# Patient Record
Sex: Male | Born: 1955 | Race: White | Hispanic: No | Marital: Married | Smoking: Never smoker
Health system: Southern US, Community
[De-identification: ages and names within clinical notes are randomized; demographics above are authoritative.]

## PROBLEM LIST (undated history)

## (undated) DIAGNOSIS — R3914 Feeling of incomplete bladder emptying: Secondary | ICD-10-CM

## (undated) DIAGNOSIS — Z973 Presence of spectacles and contact lenses: Secondary | ICD-10-CM

## (undated) DIAGNOSIS — E785 Hyperlipidemia, unspecified: Secondary | ICD-10-CM

## (undated) DIAGNOSIS — N400 Enlarged prostate without lower urinary tract symptoms: Secondary | ICD-10-CM

## (undated) DIAGNOSIS — Z87442 Personal history of urinary calculi: Secondary | ICD-10-CM

## (undated) DIAGNOSIS — M1990 Unspecified osteoarthritis, unspecified site: Secondary | ICD-10-CM

## (undated) DIAGNOSIS — I219 Acute myocardial infarction, unspecified: Secondary | ICD-10-CM

## (undated) DIAGNOSIS — Z8614 Personal history of Methicillin resistant Staphylococcus aureus infection: Secondary | ICD-10-CM

## (undated) DIAGNOSIS — G609 Hereditary and idiopathic neuropathy, unspecified: Secondary | ICD-10-CM

## (undated) DIAGNOSIS — Z974 Presence of external hearing-aid: Secondary | ICD-10-CM

## (undated) DIAGNOSIS — R7303 Prediabetes: Secondary | ICD-10-CM

## (undated) DIAGNOSIS — R399 Unspecified symptoms and signs involving the genitourinary system: Secondary | ICD-10-CM

## (undated) DIAGNOSIS — I2510 Atherosclerotic heart disease of native coronary artery without angina pectoris: Secondary | ICD-10-CM

## (undated) DIAGNOSIS — I10 Essential (primary) hypertension: Secondary | ICD-10-CM

## (undated) HISTORY — PX: COLONOSCOPY: SHX5424

## (undated) HISTORY — PX: TONSILLECTOMY: SUR1361

---

## 1999-01-26 ENCOUNTER — Ambulatory Visit (HOSPITAL_COMMUNITY): Admission: RE | Admit: 1999-01-26 | Discharge: 1999-01-26 | Payer: Self-pay | Admitting: Urology

## 2000-08-06 ENCOUNTER — Encounter: Admission: RE | Admit: 2000-08-06 | Discharge: 2000-08-06 | Payer: Self-pay | Admitting: Family Medicine

## 2000-08-06 ENCOUNTER — Encounter: Payer: Self-pay | Admitting: Family Medicine

## 2004-09-06 ENCOUNTER — Inpatient Hospital Stay (HOSPITAL_COMMUNITY): Admission: EM | Admit: 2004-09-06 | Discharge: 2004-09-13 | Payer: Self-pay | Admitting: Emergency Medicine

## 2004-09-06 ENCOUNTER — Ambulatory Visit: Payer: Self-pay | Admitting: Infectious Diseases

## 2004-10-31 ENCOUNTER — Ambulatory Visit: Payer: Self-pay | Admitting: Infectious Diseases

## 2004-11-05 ENCOUNTER — Ambulatory Visit (HOSPITAL_COMMUNITY): Admission: RE | Admit: 2004-11-05 | Discharge: 2004-11-05 | Payer: Self-pay | Admitting: Infectious Diseases

## 2005-09-21 ENCOUNTER — Emergency Department (HOSPITAL_COMMUNITY): Admission: EM | Admit: 2005-09-21 | Discharge: 2005-09-21 | Payer: Self-pay | Admitting: Emergency Medicine

## 2005-12-02 ENCOUNTER — Ambulatory Visit (HOSPITAL_COMMUNITY): Admission: RE | Admit: 2005-12-02 | Discharge: 2005-12-02 | Payer: Self-pay | Admitting: Urology

## 2005-12-02 ENCOUNTER — Ambulatory Visit (HOSPITAL_BASED_OUTPATIENT_CLINIC_OR_DEPARTMENT_OTHER): Admission: RE | Admit: 2005-12-02 | Discharge: 2005-12-02 | Payer: Self-pay | Admitting: Urology

## 2005-12-02 HISTORY — PX: OTHER SURGICAL HISTORY: SHX169

## 2006-12-30 ENCOUNTER — Encounter (INDEPENDENT_AMBULATORY_CARE_PROVIDER_SITE_OTHER): Payer: Self-pay | Admitting: Specialist

## 2006-12-30 ENCOUNTER — Ambulatory Visit (HOSPITAL_COMMUNITY): Admission: RE | Admit: 2006-12-30 | Discharge: 2006-12-30 | Payer: Self-pay | Admitting: Gastroenterology

## 2008-01-29 ENCOUNTER — Emergency Department (HOSPITAL_COMMUNITY): Admission: AC | Admit: 2008-01-29 | Discharge: 2008-01-29 | Payer: Self-pay

## 2010-09-30 ENCOUNTER — Emergency Department (HOSPITAL_BASED_OUTPATIENT_CLINIC_OR_DEPARTMENT_OTHER): Admission: EM | Admit: 2010-09-30 | Discharge: 2010-09-30 | Payer: Self-pay | Admitting: Emergency Medicine

## 2010-09-30 ENCOUNTER — Ambulatory Visit: Payer: Self-pay | Admitting: Diagnostic Radiology

## 2011-02-27 LAB — URINALYSIS, ROUTINE W REFLEX MICROSCOPIC
Bilirubin Urine: NEGATIVE
Ketones, ur: 15 mg/dL — AB
Nitrite: NEGATIVE

## 2011-04-02 IMAGING — CT CT ABD-PELV W/ CM
2 of 5 series · 16 of 46 positions shown, 18 images · IV contrast (APPLIED)
Comparison: None.

CLINICAL DATA: Left-sided abdominal pain.  Left flank pain
radiating into the left groin, acute onset earlier today.

CT ABDOMEN AND PELVIS WITH CONTRAST 09/30/2010:
TECHNIQUE: Multidetector CT imaging of the abdomen and pelvis was
performed following the standard protocol during bolus
administration of intravenous contrast.
Contrast: 100 ml Lmnipaque-XEE IV.  Water utilized as oral
contrast.

[Series 3: abd/pelvis 5.0 b31f · axial · 0.86mm/px · z∈[+771,+1241]mm · 13 of 106 slices shown, 15 images]
[im 6/106  soft-tissue]
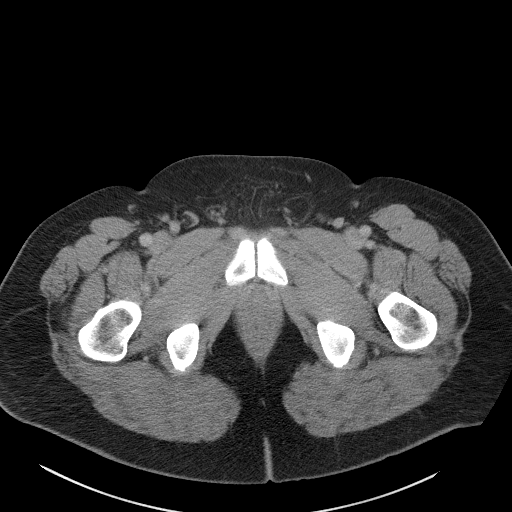
[im 6/106  bone]
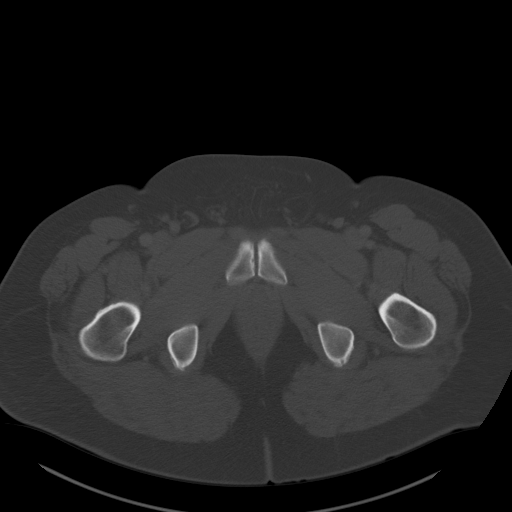
[im 17/106  soft-tissue]
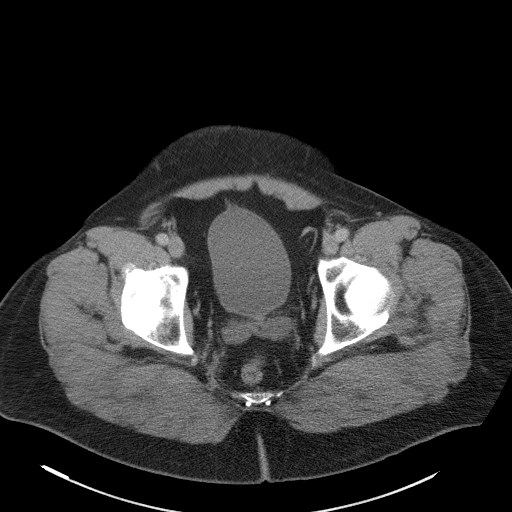
[im 23/106  soft-tissue]
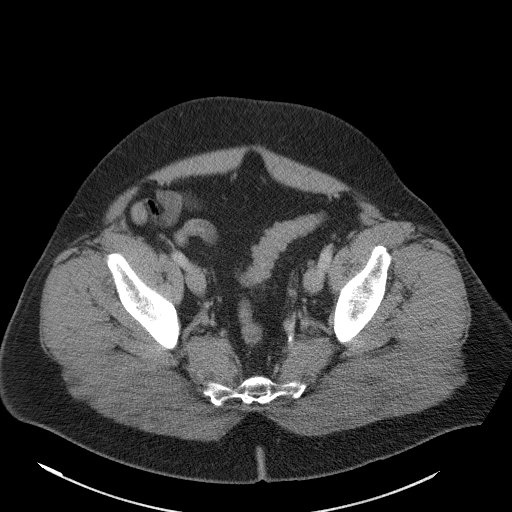
[im 28/106  soft-tissue]
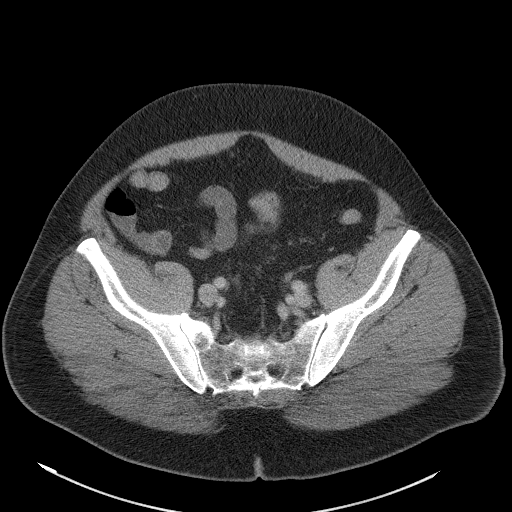
[im 39/106  soft-tissue]
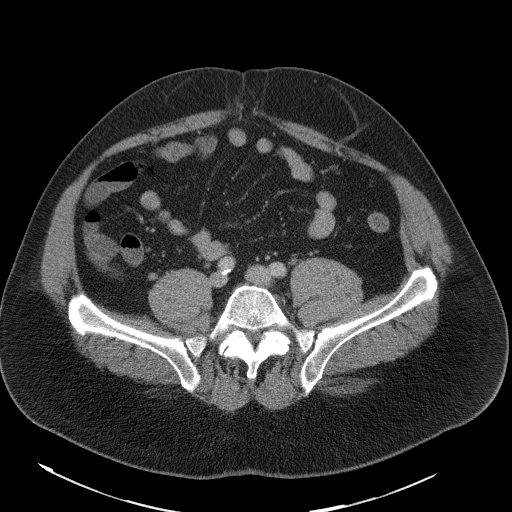
[im 45/106  soft-tissue]
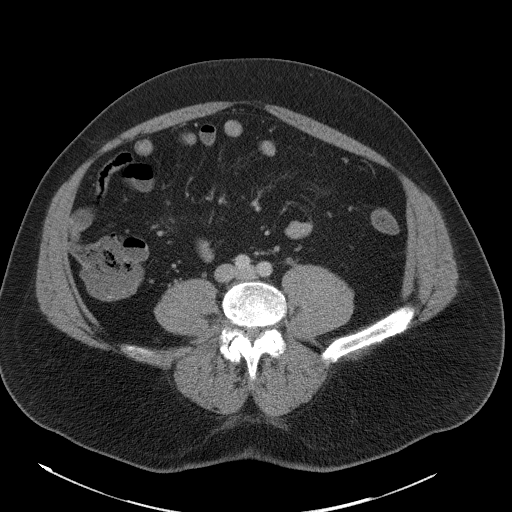
[im 56/106  soft-tissue]
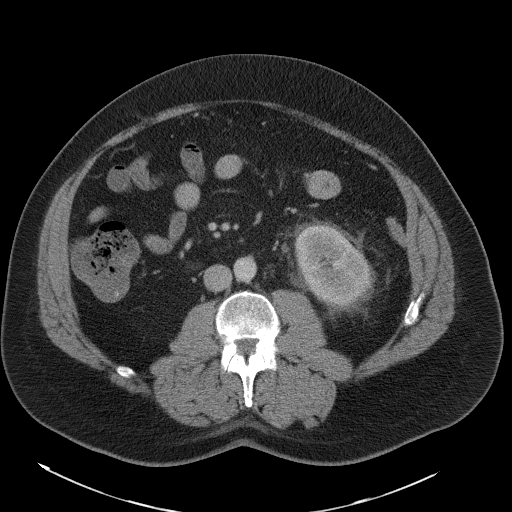
[im 61/106  soft-tissue]
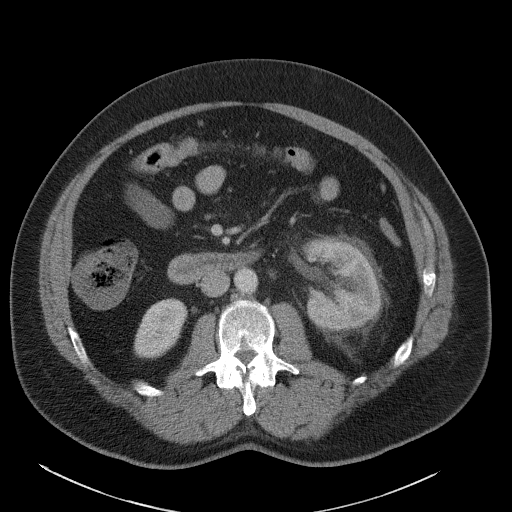
[im 67/106  soft-tissue]
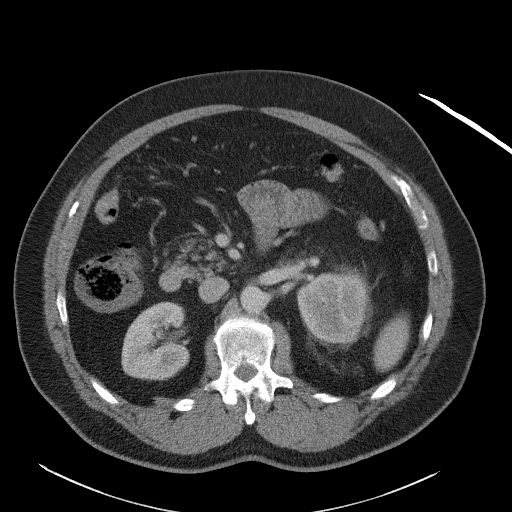
[im 67/106  bone]
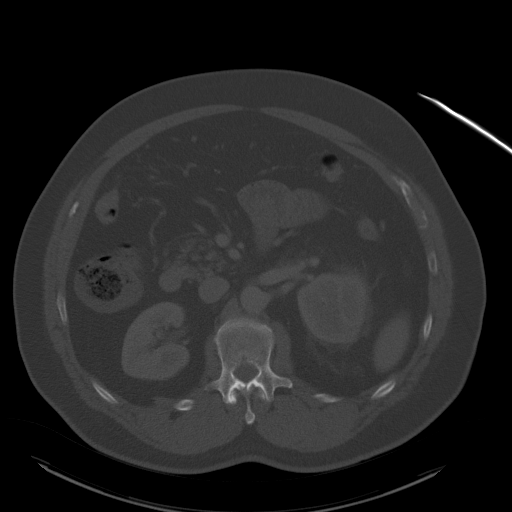
[im 78/106  soft-tissue]
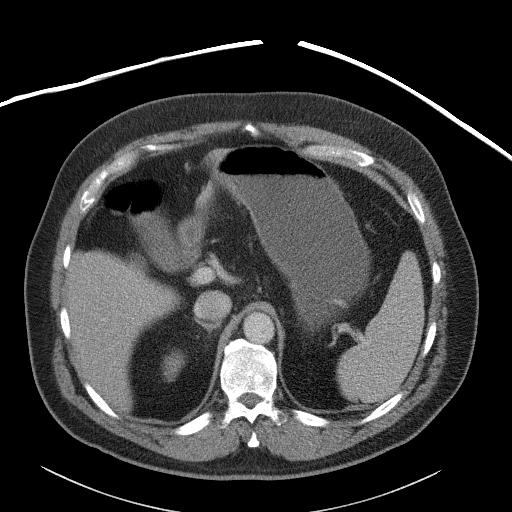
[im 83/106  soft-tissue]
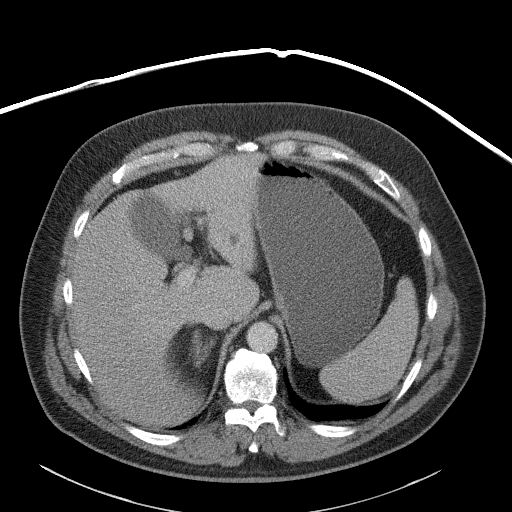
[im 89/106  soft-tissue]
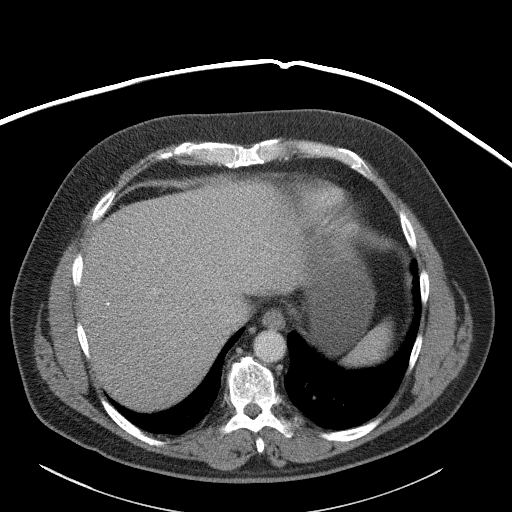
[im 100/106  soft-tissue]
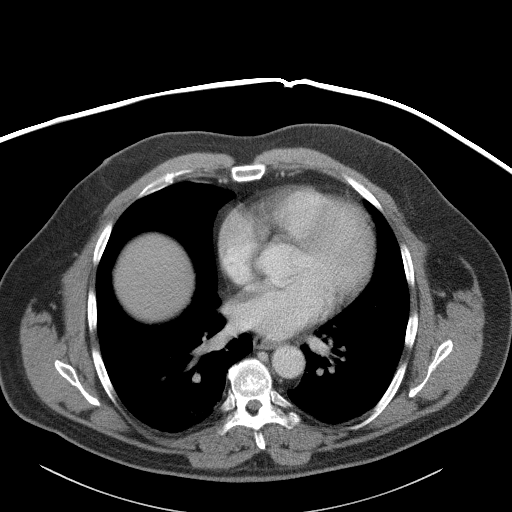

[Series 6: abd/pelvis 3.0 coronal · coronal · 1.06mm/px · 3 of 100 slices shown]
[im 34/100  soft-tissue]
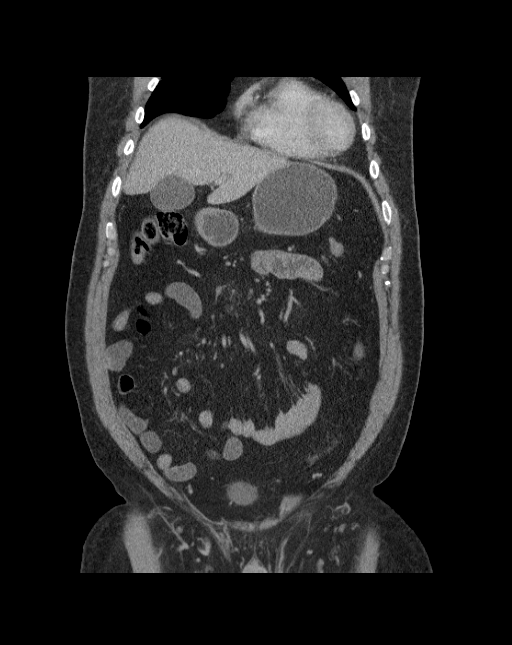
[im 45/100  soft-tissue]
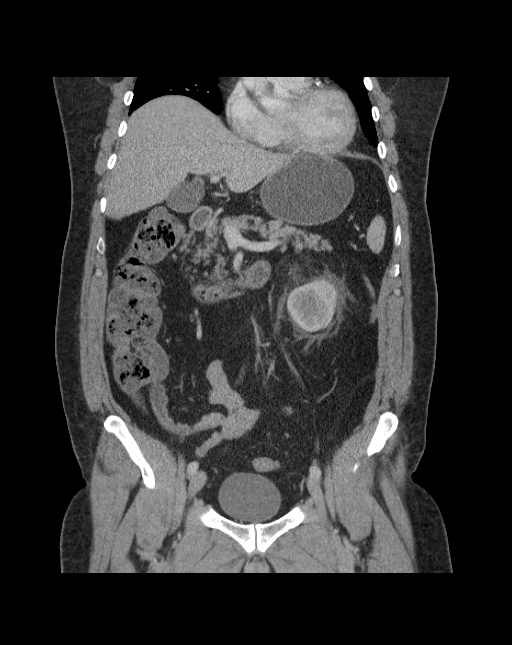
[im 56/100  soft-tissue]
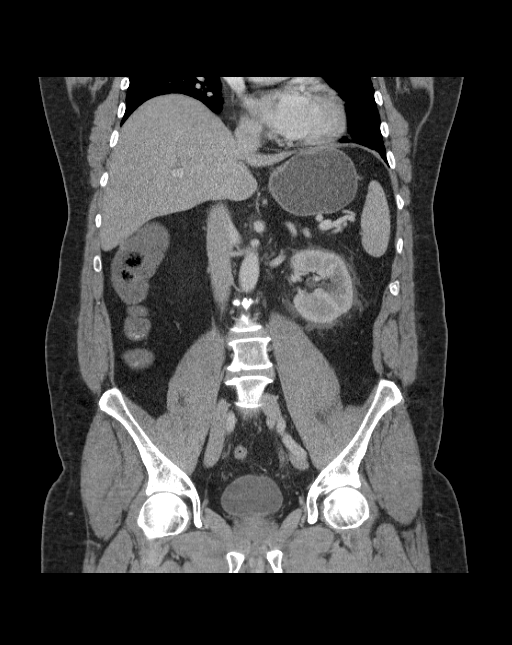

[16 of 46 positions shown; findings below may reference images not displayed]

FINDINGS: Mild left hydronephrosis.  Marked left renal edema, and
marked left perinephric edema.  Delayed excretion of contrast by
the left kidney.  This is due to an obstructing very small (2-3 mm)
distal left ureteral calculus just above the UVJ.  No other urinary
tract calculi elsewhere on either side.  No right hydronephrosis.
No focal parenchymal abnormality involving either kidney.  Urinary
bladder unremarkable.

No significant hepatic parenchymal abnormality.  Normal appearing
spleen, right adrenal gland, and gallbladder.  Calcification within
the left adrenal gland which is otherwise unremarkable.  Mild
pancreatic atrophy with fatty infiltration, particularly the head
of the pancreas, without focal parenchymal abnormality.

Stomach normal in appearance by CT.  Small bowel normal in
appearance.  Solitary sigmoid colon diverticulum without evidence
of acute diverticulitis; colon otherwise unremarkable.  Normal
appendix in the right upper pelvis.  No ascites.  Mild aorto-
iliofemoral atherosclerosis.  No significant lymphadenopathy.

Prostate gland and seminal vesicles normal for age.  Bone window
images demonstrate degenerative changes involving the lower
thoracic and lumbar spine and the right sacroiliac joint.
Visualized lung bases clear apart from the expected dependent
atelectasis posteriorly in the lower lobes.  Heart size upper
normal.
IMPRESSION: 1.  Obstructing approximate 2-3 mm distal left ureteral calculus.
2.  No urinary tract calculi elsewhere on either side.
3.  Left adrenal gland calcification, likely dystrophic and related
to an old hemorrhage.
4.  Mild pancreatic atrophy, particularly the head of the pancreas.
5.  Solitary sigmoid colon diverticulum without evidence of acute
diverticulitis.

## 2011-05-03 NOTE — Op Note (Signed)
NAME:  Joshua Gomez, Joshua Gomez              ACCOUNT NO.:  [id number]   MEDICAL RECORD NO.:  [id number]          PATIENT TYPE:  AMB   LOCATION:  [facility]                         FACILITY:  [facility]   PHYSICIAN:  Mark C. Vernie Ammons, M.D.  DATE OF BIRTH:  09/27/1956   DATE OF PROCEDURE:  12/02/2005  DATE OF DISCHARGE:                                 OPERATIVE REPORT   PREOPERATIVE DIAGNOSIS:  Right ureteral calculus, history of right ureteral  calculus and bilateral flank pain.   POSTOPERATIVE DIAGNOSIS:  Right ureteral calculus, history of right ureteral  calculus and bilateral flank pain.   PROCEDURE:  Cystoscopy and bilateral retrograde pyelograms with  interpretation.   SURGEON:  Dr. Vernie Ammons   ANESTHESIA:  General.   SPECIMENS:  None.   BLOOD LOSS:  None.   COMPLICATIONS:  None.   INDICATIONS:  The patient is 55 year old white male, who was seen earlier  this month in my office with a new diagnosis of hydronephrosis.  He had been  having some pain on the right-hand side, and he thought it was a pulled  muscle, but images a CT scan revealed stone in the distal right ureter.  It  was very small but was associated with proximal hydronephrosis.  I repeated  his CT scan, and I noted a calcification in the pelvis.  It looked like a  vascular calcification, but it appeared to be the same calcification and in  the same location as seen on his previous CT scan.  Since he continues to  have some discomfort on the right side and now has also began to note some  left-sided pain, he is  brought to the OR today for cystoscopy, retrograde  pyelogram, and possible ureteroscopy.  The risks, complications, and  alternatives were discussed.  He understands and elected to proceed.   DESCRIPTION OF OPERATION:  After informed consent, the patient brought to  the major and placed on the table, administered general anesthesia, then  moved to the dorsolithotomy position.  His genitalia was sterilely prepped  and  draped, and a 22-French cystoscope was introduced.  The urethra was  noted be entirely normal  down on the sphincter which appears intact.  There  is minimal lateral lobe hypertrophy of the prostate.  No prostatic urethral  lesions were seen.  The bladder was then entered and fully inspected and  noted to be free of any tumor, stones, or inflammatory lesions.   The ureteral orifices appeared normal in configuration and position.  I  therefore inserted a 6-French cone-tip catheter in the opening of the right  orifice and injected contrast in a retrograde fashion under direct  fluoroscopy.  This revealed the ureter to be entirely normal throughout its  length with no filling defect, mass effect, or other abnormality.  There is  no hydronephrosis noted at this time. The calyces and intrarenal collecting  system appeared sharp and without abnormality.  I then observed the contrast  peristals down the right ureter unobstructed and on into the bladder.  A  left retrograde pyelogram was performed in identical fashion with identical  findings and,  specifically, no abnormality or filling defect.  I therefore  drained the bladder.  The patient was awakened and taken  to the recovery room in stable, satisfactory condition.  He tolerated the  procedure well with no intraoperative complications.  At this point, his  back pain appears to be musculoskeletal.  If he continues to have  difficulty, he will follow up with his primary care physician and follow up  with me as previously scheduled.      Mark C. Vernie Ammons, M.D.  Electronically Signed     MCO/MEDQ  D:  12/02/2005  T:  12/03/2005  Job:  [id number]

## 2011-05-03 NOTE — H&P (Signed)
NAME:  Joshua Gomez, Joshua Gomez              ACCOUNT NO.:  [id number]   MEDICAL RECORD NO.:  [id number]          PATIENT TYPE:  EMS   LOCATION:  [facility]                         FACILITY:  [facility]   PHYSICIAN:  Juan-Carlos Monguilod, M.D.DATE OF BIRTH:  10-Aug-1956   DATE OF ADMISSION:  09/06/2004  DATE OF DISCHARGE:                                HISTORY & PHYSICAL   CHIEF COMPLAINT:  Right leg pain, edema, draining ulcers.   PROBLEM LIST:  1.  Sign and symptom complex including right leg progressive pain, edema,      erythema, draining ulcers.      1.  Recent course of Levaquin antibiotic therapy for complaints of          dysuria per Dr. Vernie Ammons of urology, August 23, 2004, office visit.      2.  Recent hunting trip to [place] in August 24, 2004, through          September 01, 2004, with no reported bug or tick bites, no wounds,          etc. Denies prolonged immobility.      3.  Seen at [facility], September 04, 2004, with          telephone order of prednisone discontinued at that time. The patient          started empirically on Septra antibiotic therapy. One of the          draining ulcers cultured producing heavy growth Staphylococcus          aureus with final identification pending.      4.  Seen in follow up at [facility], September 06, 2004, and noted worsening edema, erythema, and sent on to [facility]          emergency room.  2.  Left groin nodule. Question etiology. This apparently has developed      since his office visit with Dr. Vernie Ammons, August 23, 2004.      1.  He has a history of benign scrotal cysts previously removed by Dr.          Vernie Ammons of urology several years ago.  3.  Recent urinary tract infection/prostatitis seen by Dr. Vernie Ammons on      August 23, 2004, with course of Levaquin antibiotic therapy.  4.  Osteoarthritis on routine Celebrex.   ALLERGIES:  Listed to __________, CODEINE, and MEDROL.   HISTORY OF PRESENT  ILLNESS:  This 55 year old male is followed in outpatient  primary care at [facility]. He was seen on the 8th of  September for complaints of dysuria by Dr. Vernie Ammons of urology. He was  started on a course of Levaquin at that time. He then left for a hunting  trip in [place], August 24, 2004, through September 01, 2004. He notes he  was developing some right leg pain and an open wound near the right knee at  that time. This progressed and per phone order the patient was started on  prednisone, suspected an irritation from bug bite. This worsened  with  progressive edema, pain, erythema of the right leg. The patient was seen at  [facility] office on September 04, 2004. Suspected infected  bug bite and prednisone was then discontinued. He was started on Septra  antibiotic therapy to cover possible staph. The patient returned for follow-  up today and [facility] noted significant worsening of the  right leg despite antibiotic therapy. They sent him on to [facility]  emergency room.   FAMILY HISTORY:  Noncontributory.   SOCIAL HISTORY:  The patient lives at home with his wife. He owns his own  business. No alcohol or tobacco.   REVIEW OF SYSTEMS:  GENERAL: Anorexia and low-grade fever over the last 24  to 48 hours. HEART: Denies any history of coronary artery disease or  hypertension. Denies any central chest pain or sensation of palpitations.  LUNGS: Denies any shortness of breath or cough. Denies any increased sputum.  ABDOMEN: Denies any nausea or vomiting. Does complain of vague anorexia. No  previous problems with ulcers or colon polyps known. No constipation or  diarrhea. No bloody or tarry stools. GENITOURINARY: A history of benign  scrotal cysts with excision by Dr. Vernie Ammons distantly. Recent suspected  urinary tract infection/prostatitis with a course of Levaquin as above.  Complains currently of a left groin nodule that is new since  his office  visit, Dr. Vernie Ammons, August 23, 2004. Does complain of some current  difficulty urinating. Denies any bladder pain or CVA tenderness. Denies any  known prostate disorder. MUSCULOSKELETAL: No history of fractures. Has a  history of osteoarthritis, taking routine Celebrex.  Right leg presentation  as above. NEUROLOGIC: No history of stroke or seizure. INTEGUMENTARY: No  history of previous ulcers or lesions. No skin cancer. Several open shallow  ulcers, predominately right leg, but some smaller ones in the left lower  leg.   ADMISSION MEDICATIONS:  1.  Septra DS twice daily, started September 04, 2004.  2.  Levaquin 500 mg daily, started August 23, 2004, and I believe      subsequently discontinued when Septra started.  3.  Prednisone 20 mg taper, unclear start date, but discontinued September 04, 2004.  4.  Routine Celebrex 200 mg once daily.   LABORATORY DATA:  A CBC found to be elevated to 20.6, hemoglobin 16.0,  hematocrit 46.3, platelet count 228,000. Differential is still pending at  the time of dictation. A comprehensive metabolic panel revealed a sodium of  136, potassium 3.6, chloride 102, CO2 24, BUN 14, creatinine 1.0. Serum  glucose elevated at 152. Liver function tests are normal and albumin low at  2.9.  Chest x-ray indicates ill-defined nodular opacities in the left lung  base, chronic changes versus scarring versus developing infiltrate. Pending  blood culture times two, urine microscopic, and urine culture.   PHYSICAL EXAMINATION:  VITAL SIGNS: Temperature 100.0 rectally, pulse 114,  respirations 18, blood pressure 146/81.  O2 saturation 98% on room air.  GENERAL: A well-developed, middle-age male in no obvious distress. He is  alert, cooperative, and oriented.  HEENT: Head normocephalic. Pupils equal and reactive. Extraocular movements  intact. Ear canals are clear and  hearing grossly normal. Nares pink without masses or discharge noted. Oral  mucosa pink and moist.  NECK: No jugular venous distention or bruits. No cervical adenopathy noted.  Thyroid not palpable.  HEART: Regular rate and rhythm without murmur, S3, or S4.  LUNGS: Breath sounds clear and equal bilaterally.  CHEST:  No axillary adenopathy.  ABDOMEN: Soft. Positive bowel sounds. No pain with palpation. No  organomegaly noted.  GENITOURINARY: No bladder pain or CVA tenderness. There is a linear density  in the left groin which is mobile from the deeper tissue. There are some  small pustules in the hair follicles.  RECTAL: Deferred and noncontributory.  MUSCULOSKELETAL: Range of motion is full. Complaints of wrist and finger  joint pain.  CIRCULATORY: There is 3+ edema, foot to groin, right leg. This is pitting  more distally. Diminished but present pedal pulses bilaterally. There is  significant erythema over the calf and knee. There is pain with palpation  over the calf.  NEUROLOGIC: Cranial nerves II-XII grossly intact. No unilateral or focal  defects noted.  INTEGUMENTARY: There are several shallow ulcers over the right leg, noted  knee, ankle, lateral upper thigh. There is erythema surrounding the site in  the knee and the ankle with some clear drainage at the ankle. There are some  scabbed over sites most consistent with old bug bites scattered about the  legs, both right and left.   IMPRESSION AND PLAN:  1.  Sign and symptom complex increased edema, increased erythema, and pain      in right leg. Purulent drainage from shallow ulcers. Also seen are a few      erythematous sites in the left leg. There is a left groin density.      Multiple scabbed over and open shallow ulcers. Recent hunting trip with      possible exposure of bug bite, tick bite, thorn or thistle, poison ivy,      etc. Denies any prolonged immobility, but was in a tree stand for three      to four hours at a time. Recent Levaquin antibiotic therapy for      UTI/prostatitis per office  visit, Dr. Vernie Ammons of urology. With      development of the pain and edema in the right leg post hunting trip,      the patient had a phone order for prednisone taper. Was then seen at      [facility] office on September 04, 2004, for suspected      bug bite and resulting cellulitis, and started on Septra. The steroids      were discontinued.  Per follow up with [facility],      September 06, 2004, noted worsening of the right leg presentation      despite Septra antibiotic therapy and the patient was sent on to the      emergency room. Rule outs would include deep venous thrombosis and      possibly pulmonary emboli per abnormal chest x-ray versus infection,      status post bug bite or some type of exposure with office culture at      [facility] producing heavy Staphylococcus aureus, with      final ID pending, versus some type of vasculitis. Will treat with broad-     spectrum antibiotics, vancomycin and doxycycline IV. He had office blood      pressure 86/52 at [facility] suggesting possible      sepsis. He has been complaining of some pustule symptoms. Blood culture      times two to follow for this. IV fluids and close blood pressure      monitoring post admission, but currently blood pressure is stable at      146/81. Will need a CT scan  of the chest and lung to rule out DVT and      pulmonary emboli. Infectious disease consult will be requested. Will      follow pending blood cultures and final outpatient wound culture      results.  2.  Recent complaints of dysuria with urinary tract infection or prostatitis      per recent office visit, Dr. Vernie Ammons. The patient was placed on a course      of Levaquin. Currently complaining of dysuria and difficulty to void.      Would benefit from a short-term Foley catheter. A left groin nodule      which is new since his last urology appointment. Question adenopathy at       that site versus a cyst versus some other etiology. Apparently this is      new since his urology appointment, August 23, 2004. Will follow this      with antibiotic therapy and if it is not self resolved we will need to      further investigate.  3.  Osteoarthritis. The patient is on routine Celebrex. Will hold this for      the time being.  4.  Levaquin prophylaxis. Again, as above, pulmonary emboli and DVT remain a      rule out. Will initiate treatment dose Lovenox until these are ruled out      per CAT scan.   DISPOSITION:  General medical bed, service Dr. Nolon Rod Monguilod, [phone].   DIET:  Regular as tolerated.   ACTIVITY:  Bedrest until DVT/pulmonary emboli resolved.   CONDITION AT TIME OF ADMISSION:  Guarded.      Juan   TC/MEDQ  D:  09/06/2004  T:  09/06/2004  Job:  [id number]

## 2011-05-03 NOTE — Discharge Summary (Signed)
NAME:  Joshua Gomez, Joshua Gomez              ACCOUNT NO.:  [id number]   MEDICAL RECORD NO.:  [id number]          PATIENT TYPE:  INP   LOCATION:  3743                         FACILITY:  [facility]   PHYSICIAN:  Juan-Carlos Monguilod, M.D.DATE OF BIRTH:  1956-03-05   DATE OF ADMISSION:  09/06/2004  DATE OF DISCHARGE:                                 DISCHARGE SUMMARY   CHIEF COMPLAINT:  Right leg pain, edema and draining ulcers.   DISCHARGE DIAGNOSES:  1.  Right leg Methicillin-resistant Staph aureus cellulitis, folliculitis      with abscess and draining ulcers, significantly improved on day eight of      14 vancomycin antibiotic therapy;      1.  Deep venous thrombosis/pulmonary embolus rule out per September 07, 2004 lower extremity Doppler study which found no evidence of deep          venous thrombosis, SVT or Baker's cyst bilaterally.  CT scan          angiogram of the chest found no evidence of pulmonary embolus and no          acute pulmonary findings on September 06, 2004;      2.  Infectious disease consult with Dr. Ninetta Lights;      3.  Though blood cultures remained sterile over the course of          hospitalization, a wound culture of draining leg ulcers did produce          moderate Methicillin-resistant Staph aureus;  d):  Post discharge plan will include home health nursing to complete a 14  day course of vancomycin therapy via PICC line.  Dressings to the draining  abscesses twice daily until drainage stops and then open to air.  Following  completion of vancomycin therapy, will then complete 30 days of Septra DS.  Follow-up with Dr. Ninetta Lights of infectious disease as needed.  Follow-up with  [facility] in one to two weeks post discharge.  1.  Left groin nodule, question etiology, followed as an outpatient by Dr.      Vernie Ammons;      1.  Previous history of benign scrotal cyst removal by Dr. Vernie Ammons          several years ago.  2.  Osteoarthritis on routine home  Celebrex.  3.  Allergic rhinitis on nasal steroids.  4.  Allergies listed as CODEINE, TUSSIONEX, MEDROL.   DISCHARGE MEDICATIONS:  1.  Vancomycin 1,500 via PICC line for an additional eight days post      discharge.  2.  Clarinex 5 mg daily.  3.  Flonase one puff into each nostril twice daily.  4.  Following completion of vancomycin therapy, then start Septra DS one      tablet twice daily for an additional 30 days post discharge.  5.  Tylenol 500 mg one or two tablets every six hours as needed for pain or      fever.   DISPOSITION:  Discharge home where he lives independently.   ACTIVITY:  Light duty with right  leg elevated until swelling resolves.   SPECIAL INSTRUCTIONS:  1.  Home health nursing to provide gauze dressings to the still draining      ulcers of lower extremities until drainage stops and then sites open to      air.  2.  Home health nursing to provide vancomycin via PICC line to complete a 14      day course.  Maintain the PICC line per protocol.  3.  Follow-up with [facility] in one to two weeks post      discharge; call for an appointment.  4.  Follow-up with Dr. Ninetta Lights of infectious disease as needed.   CONSULTATIONS:  1.  Lacretia Leigh. Ninetta Lights, M.D. of infectious disease.   PROCEDURES:  1.  CT scan angiogram of the chest on September 06, 2004 with results as      above.  2.  Lower extremity venous Doppler study on September 07, 2004 with results      as above.   DISCHARGE LABORATORIES:  CBC done on September 13, 2004 found WBC elevated  though improving at 11.7, hemoglobin 13.8, hematocrit 39.4 and platelets  327,000. Admission WBC was 20.6.  Last metabolic panel on September 07, 2004  found a sodium of 135, potassium 3.7, chloride 105, C02 23, BUN 12 and  creatinine 1.0.  A vancomycin trough level on September 11, 2004 was 14.0.  Blood cultures produced no growth over the course of the hospitalization.  Urine culture produced no growth.   Wound culture produced moderate MRSA.   HISTORY OF PRESENT ILLNESS:  This 55 year old male is followed in primary  care at [facility].  He had some complaints of dysuria and  was seen by Dr. Vernie Ammons, urology, for this.  He suspected a urinary tract  infection and started a course of Levaquin. He then left for a hunting trip  in [place] on August 24, 2004 through September 01, 2004.  He developed  some right leg pain and an open wound near his right knee.  He called  [facility] office and was started on a prednisone taper  thinking this was an irritation from a bug bite.  His symptomatology  progressed with edema, pain and erythema of the right leg.  He was seen in  the [facility] office on September 04, 2004.  It was  suspected an infected bug bite at which time the prednisone was discontinued  and the patient was started on Septra antibiotic therapy to provide Staph  coverage.  He was seen in follow-up on the day of admission noting  significant worsening of the right leg presentation despite antibiotics.  He  was sent to [facility] emergency room and was admitted for  antibiotic therapy and further evaluation.  For dictated family history,  social history, review of systems, past medical history, home medications,  laboratory data, physical examination, impressions and plan, please see  dictated admission history and physical dated September 06, 2004.   HOSPITAL COURSE:  1.  METHICILLIN-RESISTANT STAPH AUREUS BILATERAL LOWER EXTREMITY      CELLULITIS/FOLLICULITIS WITH ABSCESS AND DRAINING ULCER.  The patient      presented as per the history of present illness.  There was significant      edema of the right leg and question of possible deep venous thrombosis.      There was some abnormality in his chest x-ray noting ill defined nodular     opacities at the left lung  base of uncertain chronicity.  Suspected      possible  pulmonary embolus/deep venous thrombosis.  A CT scan angiogram      of the chest was obtained on September 06, 2004 and found no evidence of      pulmonary embolus.  Lower extremity venous Doppler on September 07, 2004      found no evidence of deep venous thrombosis.  The patient was noted to      have draining ulcers/abscesses.  These were sent for culture and did      produce moderate growth MRSA.  An infectious disease consult was      obtained with Dr.  Ninetta Lights.  With vancomycin antibiotic therapy, his      fevers have resolved.  Leukocytosis has improved significantly at 11.7      at discharge.  The right leg edema and pain have significantly improved.      He continues to have a draining wound of the right and left lateral      lower leg.  The other abscesses have cleared of drainage and scabbed      over.  Edema is significantly improved in the right lower extremity.      The plan post discharge is to have a PICC line placed and to complete a      total 14 day course of vancomycin antibiotic therapy with home health to      administer and maintain PICC line site.  Following 14 days of      vancomycin, he is then to start a 30 day course of Septra DS.  Home      health is to provide gauze dressings to the lower extremities until the      drainage resolves and then may leave open to air.  We would like him to      elevated until the swelling resolves and to have light duty until that      time.  He is to follow-up with [facility] in one to two      weeks.  He is to notify his primary care physician of any worsening leg      swelling, worsening drainage, increased pain or fever over 100.5.  He      continues with his home medications as previously.  Home health nursing      will be monitoring the sites as well post discharge.   DISPOSITION:  Discharge home where he lives independently.   DISCHARGE CONDITION:  Stable and improved.      Scharlene Corn   TC/MEDQ  D:   09/13/2004  T:  09/13/2004  Job:  [id number]   cc:   Lacretia Leigh. Ninetta Lights, M.D.  [address]  [place]  [place] [address]  Fax: [phone]

## 2011-05-03 NOTE — Consult Note (Signed)
Joshua Gomez, Joshua Gomez              ACCOUNT NO.:  [id number]   MEDICAL RECORD NO.:  [id number]          PATIENT TYPE:  INP   LOCATION:  3743                         FACILITY:  [facility]   PHYSICIAN:  Joshua Gomez, M.D. DATE OF BIRTH:  1956-04-28   DATE OF CONSULTATION:  DATE OF DISCHARGE:                                   CONSULTATION   DATE OF CONSULTATION:  September 07, 2004.   HISTORY:  Mr. Joshua Gomez is a 55 year old white male, who has a history of an  MRSA infection of his lower extremities.  He was admitted with right leg  pain and edema and several ulcerations.  In the hospital, he has also  developed an area of cellulitis on his left knee that was of concern to the  medical team.  His temperature has been about 99.7.  He feels as though his  symptoms are improving.   REVIEW OF SYSTEMS:  He denies any fevers, chills, nausea, vomiting, chest  pain, shortness of breath, diarrhea, or dysuria.  The rest of his review of  systems is unremarkable.   PAST MEDICAL HISTORY:  1.  MRSA infection.  2.  UTI.  3.  Prostatitis.  4.  Osteoarthritis.  5.  Some question of a history of a tick bite.   PAST SURGICAL HISTORY:  None.   MEDICATIONS:  Vancomycin, Lovenox, Levaquin.   ALLERGIES:  1.  CODEINE.  2.  MEDROL.  3.  TUSSIONEX.   SOCIAL HISTORY:  He denies any use of alcohol or tobacco products.   FAMILY HISTORY:  Noncontributory.   PHYSICAL EXAMINATION:  GENERAL:  He is a well-developed, well-nourished,  white male in no acute distress.  VITAL SIGNS:  His temp is 99.7, blood pressure 107/78, pulse of 101.  SKIN:  Warm and dry with no jaundice.  He does have some swelling of his  right lower extremity with cellulitis as well as some small areas of  cellulitis with some ulcerations in other areas on his lower extremities.  LUNGS:  Clear bilaterally with no use of accessory respiratory muscles.  HEART:  Regular rate and rhythm with impulse in the left chest.  ABDOMEN:  Soft  and nontender with no palpable mass or hepatosplenomegaly.  EXTREMITIES:  Again, he has some significant swelling of his right lower  extremity with cellulitis and several small ulcerations with surrounding  cellulitis.  On his left knee, he has a small area of cellulitis, but no  palpable fluctuance.  PSYCHOLOGIC:  He is alert and oriented x3 with no evidence of having anxiety  or depression.   LABORATORY DATA:  On review of his lab work, it was significant for a white  count elevation of 17,000.   ASSESSMENT AND PLAN:  This is a 55 year old white male with some cellulitis  of his lower extremities with an area on his left knee.  It does not have  any palpable fluctuance to it, and I do not think that surgically opening  this area would benefit him at this time.  I would continue his intravenous  antibiotics as he feels as though  his cellulitis is improving, and we will  continue to follow closely with you and appreciate the opportunity to help  in the care of this patient.       PST/MEDQ  D:  09/07/2004  T:  09/08/2004  Job:  [id number]

## 2011-09-06 LAB — I-STAT 8, (EC8 V) (CONVERTED LAB)
BUN: 15
BUN: 17
Bicarbonate: 23.7
Chloride: 106
Glucose, Bld: 128 — ABNORMAL HIGH
Hemoglobin: 17.7 — ABNORMAL HIGH
Potassium: 3.7
Sodium: 138
pCO2, Ven: 36.9 — ABNORMAL LOW
pCO2, Ven: 45.1
pH, Ven: 7.38 — ABNORMAL HIGH

## 2013-05-19 ENCOUNTER — Other Ambulatory Visit: Payer: Self-pay | Admitting: Neurology

## 2013-07-30 ENCOUNTER — Telehealth: Payer: Self-pay | Admitting: Neurology

## 2013-07-30 NOTE — Telephone Encounter (Signed)
Pt can't make the apt that is schedule in Oct schedule by his wife, pt will be out of office that week, next apt will not be until Dec 3rd pt needs someone to call him to see if he can get in sooner. Thanks

## 2013-07-30 NOTE — Telephone Encounter (Signed)
Spoke to wife and told her the NP was available in October.  She will check with her husband and call back to either get put on a wait list for Dr. Terrace Arabia or get scheduled with Eber Jones in October.  I told her to call back and speak to one of the schedulers about this appointment.

## 2013-08-02 ENCOUNTER — Other Ambulatory Visit: Payer: Self-pay | Admitting: Neurology

## 2013-09-27 ENCOUNTER — Ambulatory Visit: Payer: Self-pay | Admitting: Neurology

## 2015-07-03 ENCOUNTER — Other Ambulatory Visit: Payer: Self-pay | Admitting: Urology

## 2015-07-19 ENCOUNTER — Encounter (HOSPITAL_BASED_OUTPATIENT_CLINIC_OR_DEPARTMENT_OTHER): Payer: Self-pay | Admitting: *Deleted

## 2015-07-19 NOTE — Progress Notes (Signed)
NPO AFTER MN.  ARRIVE AT 0715.  NEEDS HG. WILL TAKE LYRICA AM DOS W/ SIPS OF WATER.

## 2015-07-24 ENCOUNTER — Ambulatory Visit (HOSPITAL_BASED_OUTPATIENT_CLINIC_OR_DEPARTMENT_OTHER)
Admission: RE | Admit: 2015-07-24 | Discharge: 2015-07-25 | Disposition: A | Discharge status: Home / Self Care | Payer: BLUE CROSS/BLUE SHIELD | Source: Ambulatory Visit | Attending: Urology | Admitting: Urology

## 2015-07-24 ENCOUNTER — Ambulatory Visit (HOSPITAL_BASED_OUTPATIENT_CLINIC_OR_DEPARTMENT_OTHER): Payer: BLUE CROSS/BLUE SHIELD | Admitting: Anesthesiology

## 2015-07-24 ENCOUNTER — Encounter (HOSPITAL_BASED_OUTPATIENT_CLINIC_OR_DEPARTMENT_OTHER): Payer: Self-pay | Admitting: *Deleted

## 2015-07-24 ENCOUNTER — Encounter (HOSPITAL_COMMUNITY)
Admission: RE | Disposition: A | Discharge status: Home / Self Care | Payer: Self-pay | Source: Ambulatory Visit | Attending: Urology

## 2015-07-24 DIAGNOSIS — Z79899 Other long term (current) drug therapy: Secondary | ICD-10-CM | POA: Insufficient documentation

## 2015-07-24 DIAGNOSIS — M1990 Unspecified osteoarthritis, unspecified site: Secondary | ICD-10-CM | POA: Diagnosis not present

## 2015-07-24 DIAGNOSIS — R351 Nocturia: Secondary | ICD-10-CM | POA: Insufficient documentation

## 2015-07-24 DIAGNOSIS — N401 Enlarged prostate with lower urinary tract symptoms: Secondary | ICD-10-CM | POA: Insufficient documentation

## 2015-07-24 DIAGNOSIS — G609 Hereditary and idiopathic neuropathy, unspecified: Secondary | ICD-10-CM | POA: Diagnosis not present

## 2015-07-24 DIAGNOSIS — R350 Frequency of micturition: Secondary | ICD-10-CM | POA: Insufficient documentation

## 2015-07-24 DIAGNOSIS — Z87442 Personal history of urinary calculi: Secondary | ICD-10-CM | POA: Diagnosis not present

## 2015-07-24 DIAGNOSIS — Z8614 Personal history of Methicillin resistant Staphylococcus aureus infection: Secondary | ICD-10-CM | POA: Diagnosis not present

## 2015-07-24 DIAGNOSIS — N138 Other obstructive and reflux uropathy: Secondary | ICD-10-CM | POA: Insufficient documentation

## 2015-07-24 DIAGNOSIS — Z7982 Long term (current) use of aspirin: Secondary | ICD-10-CM | POA: Insufficient documentation

## 2015-07-24 DIAGNOSIS — R3915 Urgency of urination: Secondary | ICD-10-CM | POA: Insufficient documentation

## 2015-07-24 HISTORY — DX: Personal history of urinary calculi: Z87.442

## 2015-07-24 HISTORY — DX: Unspecified osteoarthritis, unspecified site: M19.90

## 2015-07-24 HISTORY — DX: Presence of spectacles and contact lenses: Z97.3

## 2015-07-24 HISTORY — DX: Presence of external hearing-aid: Z97.4

## 2015-07-24 HISTORY — DX: Benign prostatic hyperplasia without lower urinary tract symptoms: N40.0

## 2015-07-24 HISTORY — DX: Hereditary and idiopathic neuropathy, unspecified: G60.9

## 2015-07-24 HISTORY — DX: Personal history of Methicillin resistant Staphylococcus aureus infection: Z86.14

## 2015-07-24 HISTORY — DX: Feeling of incomplete bladder emptying: R39.14

## 2015-07-24 HISTORY — DX: Unspecified symptoms and signs involving the genitourinary system: R39.9

## 2015-07-24 HISTORY — PX: TRANSURETHRAL RESECTION OF PROSTATE: SHX73

## 2015-07-24 LAB — MRSA PCR SCREENING: MRSA by PCR: NEGATIVE

## 2015-07-24 LAB — GLUCOSE, CAPILLARY: Glucose-Capillary: 100 mg/dL — ABNORMAL HIGH (ref 65–99)

## 2015-07-24 SURGERY — TURP (TRANSURETHRAL RESECTION OF PROSTATE)
Anesthesia: General | Site: Prostate

## 2015-07-24 MED ORDER — ZOLPIDEM TARTRATE 5 MG PO TABS
5.0000 mg | ORAL_TABLET | Freq: Every evening | ORAL | Status: DC | PRN
  Filled 2015-07-24: qty 1

## 2015-07-24 MED ORDER — BELLADONNA ALKALOIDS-OPIUM 16.2-60 MG RE SUPP
RECTAL | Status: AC
  Filled 2015-07-24: qty 1

## 2015-07-24 MED ORDER — CIPROFLOXACIN IN D5W 400 MG/200ML IV SOLN
400.0000 mg | INTRAVENOUS | Status: AC
  Administered 2015-07-24: 400 mg via INTRAVENOUS
  Filled 2015-07-24: qty 200

## 2015-07-24 MED ORDER — FENTANYL CITRATE (PF) 100 MCG/2ML IJ SOLN
INTRAMUSCULAR | Status: AC
Start: 2015-07-24 — End: 2015-07-24
  Filled 2015-07-24: qty 6

## 2015-07-24 MED ORDER — LACTATED RINGERS IV SOLN
INTRAVENOUS | Status: DC
  Administered 2015-07-24 (×2): via INTRAVENOUS
  Filled 2015-07-24: qty 1000

## 2015-07-24 MED ORDER — ACETAMINOPHEN 500 MG PO TABS
1000.0000 mg | ORAL_TABLET | Freq: Four times a day (QID) | ORAL | Status: DC
  Filled 2015-07-24: qty 2

## 2015-07-24 MED ORDER — CIPROFLOXACIN IN D5W 200 MG/100ML IV SOLN
200.0000 mg | Freq: Two times a day (BID) | INTRAVENOUS | Status: AC
  Administered 2015-07-24: 200 mg via INTRAVENOUS
  Filled 2015-07-24: qty 100

## 2015-07-24 MED ORDER — CIPROFLOXACIN IN D5W 200 MG/100ML IV SOLN
200.0000 mg | Freq: Two times a day (BID) | INTRAVENOUS | Status: DC
  Filled 2015-07-24: qty 100

## 2015-07-24 MED ORDER — ACETAMINOPHEN 10 MG/ML IV SOLN
INTRAVENOUS | Status: DC | PRN
  Administered 2015-07-24: 1000 mg via INTRAVENOUS

## 2015-07-24 MED ORDER — HYDROCODONE-ACETAMINOPHEN 5-325 MG PO TABS
1.0000 | ORAL_TABLET | ORAL | Status: DC | PRN
  Administered 2015-07-24 – 2015-07-25 (×3): 1 via ORAL
  Filled 2015-07-24: qty 1
  Filled 2015-07-24: qty 2
  Filled 2015-07-24 (×2): qty 1

## 2015-07-24 MED ORDER — FENTANYL CITRATE (PF) 100 MCG/2ML IJ SOLN
25.0000 ug | INTRAMUSCULAR | Status: DC | PRN
  Administered 2015-07-24: 50 ug via INTRAVENOUS
  Filled 2015-07-24: qty 1

## 2015-07-24 MED ORDER — MIDAZOLAM HCL 2 MG/2ML IJ SOLN
INTRAMUSCULAR | Status: AC
  Filled 2015-07-24: qty 2

## 2015-07-24 MED ORDER — PROMETHAZINE HCL 25 MG/ML IJ SOLN
6.2500 mg | INTRAMUSCULAR | Status: DC | PRN
Start: 2015-07-24 — End: 2015-07-24
  Filled 2015-07-24: qty 1

## 2015-07-24 MED ORDER — FENTANYL CITRATE (PF) 100 MCG/2ML IJ SOLN
INTRAMUSCULAR | Status: AC
  Filled 2015-07-24: qty 2

## 2015-07-24 MED ORDER — BACITRACIN-NEOMYCIN-POLYMYXIN 400-5-5000 EX OINT
1.0000 "application " | TOPICAL_OINTMENT | Freq: Three times a day (TID) | CUTANEOUS | Status: DC | PRN
  Filled 2015-07-24: qty 1

## 2015-07-24 MED ORDER — BELLADONNA ALKALOIDS-OPIUM 16.2-60 MG RE SUPP
1.0000 | Freq: Four times a day (QID) | RECTAL | Status: DC | PRN
  Filled 2015-07-24: qty 1

## 2015-07-24 MED ORDER — DEXAMETHASONE SODIUM PHOSPHATE 10 MG/ML IJ SOLN
INTRAMUSCULAR | Status: DC | PRN
  Administered 2015-07-24: 10 mg via INTRAVENOUS

## 2015-07-24 MED ORDER — ACETAMINOPHEN 500 MG PO TABS
1000.0000 mg | ORAL_TABLET | Freq: Four times a day (QID) | ORAL | Status: DC
  Administered 2015-07-24 – 2015-07-25 (×3): 1000 mg via ORAL
  Filled 2015-07-24 (×5): qty 2

## 2015-07-24 MED ORDER — CIPROFLOXACIN IN D5W 400 MG/200ML IV SOLN
INTRAVENOUS | Status: AC
  Filled 2015-07-24: qty 200

## 2015-07-24 MED ORDER — FENTANYL CITRATE (PF) 100 MCG/2ML IJ SOLN
INTRAMUSCULAR | Status: DC | PRN
  Administered 2015-07-24 (×4): 50 ug via INTRAVENOUS

## 2015-07-24 MED ORDER — SODIUM CHLORIDE 0.9 % IV SOLN
INTRAVENOUS | Status: DC
  Administered 2015-07-24: 21:00:00 via INTRAVENOUS
  Filled 2015-07-24: qty 1000

## 2015-07-24 MED ORDER — LEVOFLOXACIN 500 MG PO TABS
500.0000 mg | ORAL_TABLET | Freq: Every day | ORAL | Status: DC
  Administered 2015-07-25: 500 mg via ORAL
  Filled 2015-07-24 (×2): qty 1

## 2015-07-24 MED ORDER — PROPOFOL 10 MG/ML IV BOLUS
INTRAVENOUS | Status: DC | PRN
  Administered 2015-07-24: 250 mg via INTRAVENOUS

## 2015-07-24 MED ORDER — ONDANSETRON HCL 4 MG/2ML IJ SOLN
4.0000 mg | INTRAMUSCULAR | Status: DC | PRN
Start: 2015-07-24 — End: 2015-07-25
  Filled 2015-07-24: qty 2

## 2015-07-24 MED ORDER — SODIUM CHLORIDE 0.9 % IR SOLN
Status: DC | PRN
  Administered 2015-07-24: 9000 mL

## 2015-07-24 MED ORDER — MIDAZOLAM HCL 5 MG/5ML IJ SOLN
INTRAMUSCULAR | Status: DC | PRN
  Administered 2015-07-24: 2 mg via INTRAVENOUS

## 2015-07-24 MED ORDER — LIDOCAINE HCL (CARDIAC) 20 MG/ML IV SOLN
INTRAVENOUS | Status: DC | PRN
  Administered 2015-07-24: 100 mg via INTRAVENOUS

## 2015-07-24 MED ORDER — ACETAMINOPHEN 325 MG PO TABS
650.0000 mg | ORAL_TABLET | ORAL | Status: DC | PRN
  Filled 2015-07-24: qty 2

## 2015-07-24 SURGICAL SUPPLY — 36 items
BAG DRAIN URO-CYSTO SKYTR STRL (DRAIN) ×2 IMPLANT
BAG DRN ANRFLXCHMBR STRAP LEK (BAG)
BAG DRN UROCATH (DRAIN) ×1
BAG URINE DRAINAGE (UROLOGICAL SUPPLIES) ×1 IMPLANT
BAG URINE LEG 19OZ MD ST LTX (BAG) IMPLANT
CANISTER SUCT LVC 12 LTR MEDI- (MISCELLANEOUS) IMPLANT
CATH FOLEY 3WAY 20FR (CATHETERS) ×1 IMPLANT
CATH FOLEY 3WAY 30CC 24FR (CATHETERS) ×2
CATH HEMA 3WAY 30CC 24FR COUDE (CATHETERS) IMPLANT
CATH HEMA 3WAY 30CC 24FR RND (CATHETERS) IMPLANT
CATH URTH STD 24FR FL 3W 2 (CATHETERS) ×1 IMPLANT
CLOTH BEACON ORANGE TIMEOUT ST (SAFETY) ×2 IMPLANT
ELECT BUTTON BIOP 24F 90D PLAS (MISCELLANEOUS) IMPLANT
ELECT LOOP MED HF 24F 12D (CUTTING LOOP) IMPLANT
ELECT REM PT RETURN 9FT ADLT (ELECTROSURGICAL) ×2
ELECTRODE REM PT RTRN 9FT ADLT (ELECTROSURGICAL) ×1 IMPLANT
EVACUATOR MICROVAS BLADDER (UROLOGICAL SUPPLIES) ×1 IMPLANT
GLOVE BIO SURGEON STRL SZ8 (GLOVE) ×2 IMPLANT
GLOVE BIOGEL M 6.5 STRL (GLOVE) ×1 IMPLANT
GLOVE BIOGEL PI IND STRL 6.5 (GLOVE) IMPLANT
GLOVE BIOGEL PI INDICATOR 6.5 (GLOVE) ×2
GOWN STRL REUS W/ TWL LRG LVL3 (GOWN DISPOSABLE) ×1 IMPLANT
GOWN STRL REUS W/ TWL XL LVL3 (GOWN DISPOSABLE) ×1 IMPLANT
GOWN STRL REUS W/TWL LRG LVL3 (GOWN DISPOSABLE) ×2
GOWN STRL REUS W/TWL XL LVL3 (GOWN DISPOSABLE) ×2
HOLDER FOLEY CATH W/STRAP (MISCELLANEOUS) ×2 IMPLANT
IV NS IRRIG 3000ML ARTHROMATIC (IV SOLUTION) ×3 IMPLANT
LOOP CUT BIPOLAR 24F LRG (ELECTROSURGICAL) ×2 IMPLANT
MANIFOLD NEPTUNE II (INSTRUMENTS) ×1 IMPLANT
PACK CYSTO (CUSTOM PROCEDURE TRAY) ×2 IMPLANT
PLUG CATH AND CAP STER (CATHETERS) IMPLANT
SET ASPIRATION TUBING (TUBING) ×2 IMPLANT
SET BERKELEY SUCTION TUBING (SUCTIONS) ×1 IMPLANT
SYR 30ML LL (SYRINGE) IMPLANT
SYRINGE IRR TOOMEY STRL 70CC (SYRINGE) IMPLANT
WATER STERILE IRR 3000ML UROMA (IV SOLUTION) IMPLANT

## 2015-07-24 NOTE — Discharge Instructions (Signed)

## 2015-07-24 NOTE — Progress Notes (Signed)
Patient ID: Joshua Gomez, male   DOB: 01/03/1956, 59 y.o.   MRN: [id number] Night of surgery note  Subjective: The patient is doing well.  No complaints. Still pretty groggy.  Objective: Vital signs in last 24 hours: Temp:  [97.3 F (36.3 C)-97.7 F (36.5 C)] 97.3 F (36.3 C) (08/08 1143) Pulse Rate:  [59-90] 59 (08/08 1143) Resp:  [7-18] 16 (08/08 1143) BP: (104-136)/(54-78) 121/78 mmHg (08/08 1143) SpO2:  [92 %-98 %] 98 % (08/08 1143) Weight:  [131.543 kg (290 lb)] 131.543 kg (290 lb) (08/08 1143)  Intake/Output this shift: Total I/O In: 1150 [P.O.:100; I.V.:750; Other:300] Out: 200 [Urine:200]  Physical Exam:  General: Alert and oriented. Abdomen: Soft, Nondistended. Catheter draining completely clear urine  Lab Results: No results for input(s): HGB, HCT in the last 72 hours.  Assessment/Plan: 1) Continue to monitor 2) Per orders   Joshua Gomez C. Joshua Ammons, MD  Joshua Gomez 07/24/2015, 2:07 PM

## 2015-07-24 NOTE — Anesthesia Postprocedure Evaluation (Signed)
  Anesthesia Post-op Note  Patient: Joshua Gomez  Procedure(s) Performed: Procedure(s) (LRB): TRANSURETHRAL RESECTION OF THE PROSTATE (TURP) WITH GYRUS (N/A)  Patient Location: PACU  Anesthesia Type: General  Level of Consciousness: awake and alert   Airway and Oxygen Therapy: Patient Spontanous Breathing  Post-op Pain: mild  Post-op Assessment: Post-op Vital signs reviewed, Patient's Cardiovascular Status Stable, Respiratory Function Stable, Patent Airway and No signs of Nausea or vomiting  Last Vitals:  Filed Vitals:   07/24/15 1143  BP: 121/78  Pulse: 59  Temp: 36.3 C  Resp: 16    Post-op Vital Signs: stable   Complications: No apparent anesthesia complications

## 2015-07-24 NOTE — Op Note (Signed)
PATIENT:  Joshua Gomez  PRE-OPERATIVE DIAGNOSIS: BPH with outlet obstruction  POST-OPERATIVE DIAGNOSIS: Same  PROCEDURE: TURP  SURGEON:  [name]  INDICATION: Joshua Gomez is a 59 year old male with significant obstructive voiding symptoms who has elected to proceed with transurethral resection of his prostate.  ANESTHESIA:  General  EBL:  Minimal  DRAINS: 20 [place], three-way Foley catheter  LOCAL MEDICATIONS USED:  None  SPECIMEN:  Prostate chips to pathology  Description of procedure: After informed consent the patient was taken to the operating room and placed on the table in a supine position. General anesthesia was then administered. Once fully anesthetized the patient was moved to the dorsal lithotomy position and the genitalia were sterilely prepped and draped in standard fashion. An official timeout was then performed.  He had a fairly small meatus visually and I therefore dilated him with [employer] sounds from 18 up to 28 [place]. I then passed the 26 French resectoscope sheath with the visual obturator was passed into the bladder and the obturator removed. I then inserted the resectoscope element with 30 lens and  performed a systematic inspection of the bladder. I noted the bladder had 1-2 plus trabeculation but was free of any tumor stones or inflammatory lesions. The ureteral orifices were noted to be of normal configuration and position. Withdrawing the scope into the prostatic urethra I noted he was obstructed from primarily high bladder neck in association with lateral lobe hypertrophy  Resection was then begun I. first resected the median lobe in the midline down to the level of the bladder neck. I then began resecting the left lobe of the prostate by resecting first from the level of the bladder neck back to the level of the Veru at the 5:00 position and then progressed in a counterclockwise direction resecting all of the adenomatous tissue of the left lobe  down to the surgical capsule. Bleeding points were cauterized as they were encountered. I then turned my attention to the right lobe of the prostate and it was resected in an identical fashion. Tissue in the area of the apex was then resected circumferentially with care being taken to maintain the resection proximal to the Veru at all times. The prostatic chips were then flushed into the bladder and the Microvasive evacuator was then used to evacuate all chips from the bladder. Reinspection of the bladder revealed the mucosa to be intact, the ureteral orifices intact as well and well away from the bladder neck and area of resection. There were no prostatic chips remaining within the bladder. The prostatic capsule was intact throughout with no perforation and there was no active bleeding noted at the end of the procedure.  The resectoscope was therefore removed and the 22 French three-way Foley catheter was then inserted and balloon was filled to 20 cc. This was irrigated with the irrigant returning clear. The catheter was then hooked to closed system drainage and continuous irrigation and the patient was awakened and taken to recovery room in stable and satisfactory condition. He tolerated the procedure well and there were no intraoperative complications.   PLAN OF CARE: Discharge to home after PACU  PATIENT DISPOSITION:  PACU - hemodynamically stable.

## 2015-07-24 NOTE — Anesthesia Procedure Notes (Signed)
Procedure Name: LMA Insertion Date/Time: 07/24/2015 9:11 AM Performed by: Maris Berger T Pre-anesthesia Checklist: Patient identified, Emergency Drugs available, Suction available and Patient being monitored Patient Re-evaluated:Patient Re-evaluated prior to inductionOxygen Delivery Method: Circle System Utilized Preoxygenation: Pre-oxygenation with 100% oxygen Intubation Type: IV induction Ventilation: Mask ventilation without difficulty LMA: LMA inserted LMA Size: 5.0 Number of attempts: 1 Airway Equipment and Method: Bite block Placement Confirmation: positive ETCO2 Tube secured with: Tape Dental Injury: Teeth and Oropharynx as per pre-operative assessment

## 2015-07-24 NOTE — H&P (Signed)
Reason For Visit Joshua Gomez is a 59 year old male with "difficulty urinating".   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    BPH with outlet obstruction: Joshua Gomez has had slowing of his urinary stream in the past.    Interval history: Joshua Gomez continues to have obstructive voiding symptoms on tamsulosin and a 0.8 mg dose. Since increasing the dosage she has noted some slight dizziness and what Joshua Gomez describes as a fuzzy headed feeling. Joshua Gomez wanted to discuss further treatment options.  IPSS 06/06/15: 22/unhappy   Past Medical History Problems  1. History of Epididymal cyst (N50.3) 2. History of arthritis (Z87.39) 3. 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 History of Surgery Epididymis Excision Of Spermatocele Left 2. History of Surgery Epididymis Excision Of Spermatocele Right 3. History of Surgery Penis Destruction Of Lesion(S) Simple Excision  Current Meds 1. Aspirin 81 MG TABS;  Therapy: (Recorded:21Jun2016) to Recorded 2. Lyrica CAPS;  Therapy: (Recorded:21Jun2016) to Recorded 3. Meloxicam 7.5 MG Oral Tablet;  Therapy: (Recorded:21Jun2016) to Recorded 4. Tamsulosin HCl - 0.4 MG Oral Capsule; Take 1 capsule twice daily;  Therapy: (Recorded:21Jun2016) to Recorded  Allergies Medication  1. No Known Drug Allergies  Family History Problems  1. Family history of acute myocardial infarction (Z82.49) : Mother 2. Family history of malignant neoplasm (Z80.9) : Father  Social History Problems    Denied: History of Alcohol use   Denied: History of Caffeine use   Father deceased   Married   Mother deceased   Never a smoker   Number of children   2 daughters   Occupation   self employeed-builds houses  Review of Systems Genitourinary,  constitutional, skin, eye, otolaryngeal, hematologic/lymphatic, cardiovascular, pulmonary, endocrine, musculoskeletal, gastrointestinal, neurological and psychiatric system(s) were reviewed and pertinent findings if present are noted and are otherwise negative.  Genitourinary: urinary frequency, feelings of urinary urgency, nocturia and difficulty starting the urinary stream.  Integumentary: skin rash/lesion.  Hematologic/Lymphatic: a tendency to easily bruise.    Vitals Vital Signs  Height: 6 ft  Weight: 285 lb  BMI Calculated: 38.65 BSA Calculated: 2.48 Blood Pressure: 107 / 73 Heart Rate: 67  Physical Exam Constitutional: Well nourished and well developed . No acute distress.  ENT:. The ears and nose are normal in appearance.  Neck: The appearance of the neck is normal and no neck mass is present.  Pulmonary: No respiratory distress and normal respiratory rhythm and effort.  Cardiovascular: Heart rate and rhythm are normal . No peripheral edema.  Abdomen: The abdomen is mildly obese. The abdomen is soft and nontender. No masses are palpated. No CVA tenderness. No hernias are palpable. No hepatosplenomegaly noted.  Rectal: Rectal exam demonstrates normal sphincter tone, no tenderness and no masses. Estimated prostate size is 2+. The prostate has no nodularity and is not tender. The left seminal vesicle is nonpalpable. The right seminal vesicle is nonpalpable. The perineum is normal on inspection.  Genitourinary: Examination of the penis demonstrates no discharge, no masses, no lesions and a normal meatus. The scrotum is without lesions. The right epididymis is palpably normal and non-tender. The left epididymis is palpably normal and non-tender. The right testis is non-tender and without masses. The left testis is non-tender and without masses.  Lymphatics: The femoral and inguinal nodes are not enlarged or tender.  Skin: Normal  skin turgor, no visible rash and no visible skin lesions.   Neuro/Psych:. Mood and affect are appropriate.    Results/Data Urine  COLOR YELLOW  APPEARANCE CLEAR  SPECIFIC GRAVITY 1.010  pH 5.5  GLUCOSE NEG mg/dL BILIRUBIN NEG  KETONE NEG mg/dL BLOOD TRACE  PROTEIN NEG mg/dL UROBILINOGEN 0.2 mg/dL NITRITE NEG  LEUKOCYTE ESTERASE NEG  SQUAMOUS EPITHELIAL/HPF NONE SEEN  WBC NONE SEEN WBC/hpf RBC NONE SEEN RBC/hpf BACTERIA NONE SEEN  CRYSTALS NONE SEEN  CASTS NONE SEEN   The following images/tracing/specimen were independently visualized:  ultrasound PVR as below.  The following clinical lab reports were reviewed:  UA: Clear.  PVR: Ultrasound PVR 31 ml. Joshua Gomez appears to be emptying his bladder adequately.    Assessment   Joshua Gomez has significant voiding symptoms and is having some side effects from the higher dose of tamsulosin. We discussed the options which would be continued medical management with the addition of finasteride for Cialis for daily use. The other option would be a transurethral resection of his prostate. Joshua Gomez was interested in this and therefore I went over the procedure with him in detail. We discussed how the procedure was performed, its risks and complications, the probability of success as well as the need for an overnight stay in the hospital and the anticipated postoperative course. I have answered his questions regarding surgery and would like to proceed with this.  Plan   Joshua Gomez will be scheduled for TURP.

## 2015-07-24 NOTE — Anesthesia Preprocedure Evaluation (Addendum)
Anesthesia Evaluation  Patient identified by MRN, date of birth, ID band Patient awake    Reviewed: Allergy & Precautions, NPO status , Patient's Chart, lab work & pertinent test results  Airway Mallampati: II  TM Distance: >3 FB Neck ROM: Full    Dental no notable dental hx.    Pulmonary neg pulmonary ROS,  breath sounds clear to auscultation  Pulmonary exam normal       Cardiovascular negative cardio ROS Normal cardiovascular examRhythm:Regular Rate:Normal     Neuro/Psych Idiopathic peripheral neuropathy  Neuromuscular disease negative psych ROS   GI/Hepatic negative GI ROS, Neg liver ROS,   Endo/Other  negative endocrine ROS  Renal/GU negative Renal ROS  negative genitourinary   Musculoskeletal  (+) Arthritis -,   Abdominal   Peds negative pediatric ROS (+)  Hematology negative hematology ROS (+)   Anesthesia Other Findings   Reproductive/Obstetrics negative OB ROS                            Anesthesia Physical Anesthesia Plan  ASA: II  Anesthesia Plan: General   Post-op Pain Management:    Induction: Intravenous  Airway Management Planned: LMA  Additional Equipment:   Intra-op Plan:   Post-operative Plan: Extubation in OR  Informed Consent: I have reviewed the patients History and Physical, chart, labs and discussed the procedure including the risks, benefits and alternatives for the proposed anesthesia with the patient or authorized representative who has indicated his/her understanding and acceptance.   Dental advisory given  Plan Discussed with: CRNA  Anesthesia Plan Comments:         Anesthesia Quick Evaluation

## 2015-07-24 NOTE — Care Management Note (Signed)
Case Management Note  Patient Details  Name: Joshua Gomez MRN: [id number] Date of Birth: 08-Mar-1956  Subjective/Objective: 59 y/o m admitted w/BPH, s/p TURP. From home.                   Action/Plan:d/c plan home.   Expected Discharge Date:                  Expected Discharge Plan:  Home/Self Care  In-House Referral:     Discharge planning Services  CM Consult  Post Acute Care Choice:    Choice offered to:     DME Arranged:    DME Agency:     HH Arranged:    HH Agency:     Status of Service:  In process, will continue to follow  Medicare Important Message Given:    Date Medicare IM Given:    Medicare IM give by:    Date Additional Medicare IM Given:    Additional Medicare Important Message give by:     If discussed at Long Length of Stay Meetings, dates discussed:    Additional Comments:  Lanier Clam, RN 07/24/2015, 2:46 PM

## 2015-07-24 NOTE — Transfer of Care (Signed)
Immediate Anesthesia Transfer of Care Note  Patient: Joshua Gomez  Procedure(s) Performed: Procedure(s): TRANSURETHRAL RESECTION OF THE PROSTATE (TURP) WITH GYRUS (N/A)  Patient Location: PACU  Anesthesia Type:General  Level of Consciousness: awake and oriented  Airway & Oxygen Therapy: Patient Spontanous Breathing and Patient connected to nasal cannula oxygen  Post-op Assessment: Report given to RN  Post vital signs: Reviewed and stable  Last Vitals:  Filed Vitals:   07/24/15 0726  BP: 125/75  Pulse: 70  Temp: 36.5 C  Resp: 18    Complications: No apparent anesthesia complications

## 2015-07-25 ENCOUNTER — Encounter (HOSPITAL_BASED_OUTPATIENT_CLINIC_OR_DEPARTMENT_OTHER): Payer: Self-pay | Admitting: Urology

## 2015-07-25 DIAGNOSIS — N401 Enlarged prostate with lower urinary tract symptoms: Secondary | ICD-10-CM | POA: Diagnosis not present

## 2015-07-25 LAB — POCT HEMOGLOBIN-HEMACUE: Hemoglobin: 15.7 g/dL (ref 13.0–17.0)

## 2015-07-25 MED ORDER — OXYCODONE HCL 10 MG PO TABS: 10.0000 mg | ORAL_TABLET | ORAL | Status: DC | PRN

## 2015-07-25 MED ORDER — PHENAZOPYRIDINE HCL 200 MG PO TABS: 200.0000 mg | ORAL_TABLET | Freq: Three times a day (TID) | ORAL | Status: DC | PRN

## 2015-07-25 NOTE — Discharge Summary (Signed)
  Physician Discharge Summary  Patient ID: Joshua Gomez MRN: [id number] DOB/AGE: 1955-12-18 59 y.o.  Admit date: 07/24/2015 Discharge date: 07/25/2015  Admission Diagnoses: BPH  Discharge Diagnoses:  Active Problems:   BPH (benign prostatic hypertrophy) with urinary obstruction   Discharged Condition: good  Hospital Course: He was admitted for an elective transurethral resection of his prostate due to outlet obstructive symptoms.  He underwent his TURP without complication and was observed overnight on continuous bladder irrigation. The evening of his surgery his urine was noted to be completely clear with no clots.  He continues to do well with no nausea and his catheter has been removed.  He is felt ready for discharge once he voids.  Significant Diagnostic Studies: No results found.  Discharge Exam: Blood pressure 142/82, pulse 74, temperature 98 F (36.7 C), temperature source Oral, resp. rate 16, height 6' (1.829 m), weight 131.543 kg (290 lb), SpO2 94 %. His abdomen was noted to be flat and nondistended.   Disposition:   Discharge Instructions    Discharge patient    Complete by:  As directed             Medication List    STOP taking these medications        tamsulosin 0.4 MG Caps capsule  Commonly known as:  FLOMAX     terazosin 5 MG capsule  Commonly known as:  HYTRIN      TAKE these medications        aspirin EC 81 MG tablet  Take 81 mg by mouth daily.     fluticasone 50 MCG/ACT nasal spray  Commonly known as:  FLONASE  Place 2 sprays into both nostrils daily.     JOINT HEALTH PO  Take 2 tablets by mouth daily.     Krill Oil Ultra Strength 1500 MG Caps  Take 1 capsule by mouth daily.     meloxicam 15 MG tablet  Commonly known as:  MOBIC  Take 15 mg by mouth daily.     Oxycodone HCl 10 MG Tabs  Take 1 tablet (10 mg total) by mouth every 4 (four) hours as needed.     phenazopyridine 200 MG tablet  Commonly known as:  PYRIDIUM  Take 1  tablet (200 mg total) by mouth 3 (three) times daily as needed for pain.     pregabalin 50 MG capsule  Commonly known as:  LYRICA  Take 50-100 mg by mouth 2 (two) times daily.  in the morning and  in the evening           Follow-up Information    Follow up with [name], MD On 07/31/2015.   Specialty:  Urology   Why:  For your appiontment at 9:30   Contact information:   [address] [place] [address] [phone]       Signed: [name] 07/25/2015, 7:04 AM

## 2015-10-05 ENCOUNTER — Other Ambulatory Visit: Payer: Self-pay | Admitting: Orthopaedic Surgery

## 2015-10-05 DIAGNOSIS — M542 Cervicalgia: Secondary | ICD-10-CM

## 2015-10-18 ENCOUNTER — Ambulatory Visit
Admission: RE | Admit: 2015-10-18 | Discharge: 2015-10-18 | Disposition: A | Discharge status: Home / Self Care | Payer: BLUE CROSS/BLUE SHIELD | Source: Ambulatory Visit | Attending: Orthopaedic Surgery | Admitting: Orthopaedic Surgery

## 2015-10-18 DIAGNOSIS — M542 Cervicalgia: Secondary | ICD-10-CM

## 2022-08-19 ENCOUNTER — Other Ambulatory Visit: Payer: Self-pay

## 2022-08-19 ENCOUNTER — Inpatient Hospital Stay (HOSPITAL_COMMUNITY)
Admission: EM | Admit: 2022-08-19 | Discharge: 2022-08-21 | DRG: 247 | Disposition: A | Discharge status: Home / Self Care | Payer: Medicare Other | Attending: Internal Medicine | Admitting: Internal Medicine

## 2022-08-19 ENCOUNTER — Emergency Department (HOSPITAL_COMMUNITY): Payer: Medicare Other

## 2022-08-19 ENCOUNTER — Encounter (HOSPITAL_COMMUNITY): Payer: Self-pay | Admitting: Emergency Medicine

## 2022-08-19 DIAGNOSIS — N400 Enlarged prostate without lower urinary tract symptoms: Secondary | ICD-10-CM | POA: Diagnosis present

## 2022-08-19 DIAGNOSIS — G609 Hereditary and idiopathic neuropathy, unspecified: Secondary | ICD-10-CM | POA: Diagnosis present

## 2022-08-19 DIAGNOSIS — Z791 Long term (current) use of non-steroidal anti-inflammatories (NSAID): Secondary | ICD-10-CM | POA: Diagnosis not present

## 2022-08-19 DIAGNOSIS — Z79899 Other long term (current) drug therapy: Secondary | ICD-10-CM | POA: Diagnosis not present

## 2022-08-19 DIAGNOSIS — Z974 Presence of external hearing-aid: Secondary | ICD-10-CM | POA: Diagnosis not present

## 2022-08-19 DIAGNOSIS — R079 Chest pain, unspecified: Secondary | ICD-10-CM | POA: Diagnosis present

## 2022-08-19 DIAGNOSIS — E782 Mixed hyperlipidemia: Secondary | ICD-10-CM | POA: Diagnosis not present

## 2022-08-19 DIAGNOSIS — M1990 Unspecified osteoarthritis, unspecified site: Secondary | ICD-10-CM | POA: Diagnosis present

## 2022-08-19 DIAGNOSIS — I10 Essential (primary) hypertension: Secondary | ICD-10-CM | POA: Diagnosis present

## 2022-08-19 DIAGNOSIS — E785 Hyperlipidemia, unspecified: Secondary | ICD-10-CM | POA: Diagnosis present

## 2022-08-19 DIAGNOSIS — I25110 Atherosclerotic heart disease of native coronary artery with unstable angina pectoris: Secondary | ICD-10-CM | POA: Diagnosis present

## 2022-08-19 DIAGNOSIS — I2510 Atherosclerotic heart disease of native coronary artery without angina pectoris: Secondary | ICD-10-CM | POA: Diagnosis not present

## 2022-08-19 DIAGNOSIS — I214 Non-ST elevation (NSTEMI) myocardial infarction: Secondary | ICD-10-CM | POA: Diagnosis present

## 2022-08-19 DIAGNOSIS — Z955 Presence of coronary angioplasty implant and graft: Secondary | ICD-10-CM | POA: Diagnosis not present

## 2022-08-19 DIAGNOSIS — Z7982 Long term (current) use of aspirin: Secondary | ICD-10-CM | POA: Diagnosis not present

## 2022-08-19 DIAGNOSIS — I249 Acute ischemic heart disease, unspecified: Principal | ICD-10-CM

## 2022-08-19 LAB — BASIC METABOLIC PANEL
Anion gap: 12 (ref 5–15)
BUN: 20 mg/dL (ref 8–23)
CO2: 23 mmol/L (ref 22–32)
Calcium: 9.6 mg/dL (ref 8.9–10.3)
Chloride: 107 mmol/L (ref 98–111)
Creatinine, Ser: 1 mg/dL (ref 0.61–1.24)
GFR, Estimated: >60 mL/min (ref >60)
Glucose, Bld: 126 mg/dL — ABNORMAL HIGH (ref 70–99)
Potassium: 3.7 mmol/L (ref 3.5–5.1)
Sodium: 142 mmol/L (ref 135–145)

## 2022-08-19 LAB — HEPATIC FUNCTION PANEL
ALT: 21 U/L (ref 0–44)
AST: 43 U/L — ABNORMAL HIGH (ref 15–41)
Albumin: 3.7 g/dL (ref 3.5–5.0)
Alkaline Phosphatase: 29 U/L — ABNORMAL LOW (ref 38–126)
Bilirubin, Direct: 0.1 mg/dL (ref 0.0–0.2)
Indirect Bilirubin: 0.7 mg/dL (ref 0.3–0.9)
Total Bilirubin: 0.8 mg/dL (ref 0.3–1.2)
Total Protein: 6 g/dL — ABNORMAL LOW (ref 6.5–8.1)

## 2022-08-19 LAB — TROPONIN I (HIGH SENSITIVITY)
Troponin I (High Sensitivity): 140 ng/L (ref <18)
Troponin I (High Sensitivity): 501 ng/L (ref <18)

## 2022-08-19 LAB — CBC
HCT: 49.9 % (ref 39.0–52.0)
Hemoglobin: 16.2 g/dL (ref 13.0–17.0)
MCH: 32.1 pg (ref 26.0–34.0)
MCHC: 32.5 g/dL (ref 30.0–36.0)
MCV: 98.8 fL (ref 80.0–100.0)
Platelets: 207 10*3/uL (ref 150–400)
RBC: 5.05 MIL/uL (ref 4.22–5.81)
RDW: 12 % (ref 11.5–15.5)
WBC: 10.1 10*3/uL (ref 4.0–10.5)
nRBC: 0 % (ref 0.0–0.2)

## 2022-08-19 LAB — TSH: TSH: 0.772 u[IU]/mL (ref 0.350–4.500)

## 2022-08-19 LAB — T4, FREE: Free T4: 0.8 ng/dL (ref 0.61–1.12)

## 2022-08-19 LAB — MAGNESIUM: Magnesium: 1.9 mg/dL (ref 1.7–2.4)

## 2022-08-19 MED ORDER — ROSUVASTATIN CALCIUM 5 MG PO TABS
5.0000 mg | ORAL_TABLET | Freq: Every day | ORAL | Status: DC
  Administered 2022-08-19: 5 mg via ORAL
  Filled 2022-08-19: qty 1

## 2022-08-19 MED ORDER — NITROGLYCERIN IN D5W 200-5 MCG/ML-% IV SOLN
0.0000 ug/min | INTRAVENOUS | Status: DC
  Administered 2022-08-19: 5 ug/min via INTRAVENOUS
  Filled 2022-08-19: qty 250

## 2022-08-19 MED ORDER — NITROGLYCERIN 0.4 MG SL SUBL
0.4000 mg | SUBLINGUAL_TABLET | SUBLINGUAL | Status: DC | PRN
  Administered 2022-08-19: 0.4 mg via SUBLINGUAL
  Filled 2022-08-19: qty 1

## 2022-08-19 MED ORDER — ACETAMINOPHEN 325 MG PO TABS
650.0000 mg | ORAL_TABLET | ORAL | Status: DC | PRN
  Administered 2022-08-20: 650 mg via ORAL
  Filled 2022-08-19: qty 2

## 2022-08-19 MED ORDER — HEPARIN (PORCINE) 25000 UT/250ML-% IV SOLN
1500.0000 [IU]/h | INTRAVENOUS | Status: DC
  Administered 2022-08-19: 1200 [IU]/h via INTRAVENOUS
  Administered 2022-08-20: 1500 [IU]/h via INTRAVENOUS
  Filled 2022-08-19 (×2): qty 250

## 2022-08-19 MED ORDER — NITROGLYCERIN 0.4 MG SL SUBL: 0.4000 mg | SUBLINGUAL_TABLET | SUBLINGUAL | Status: DC | PRN

## 2022-08-19 MED ORDER — SODIUM CHLORIDE 0.9 % IV SOLN: INTRAVENOUS | Status: DC

## 2022-08-19 MED ORDER — ALPRAZOLAM 0.25 MG PO TABS: 0.2500 mg | ORAL_TABLET | Freq: Two times a day (BID) | ORAL | Status: DC | PRN

## 2022-08-19 MED ORDER — ASPIRIN 81 MG PO CHEW
324.0000 mg | CHEWABLE_TABLET | ORAL | Status: AC
  Administered 2022-08-19: 324 mg via ORAL
  Filled 2022-08-19: qty 4

## 2022-08-19 MED ORDER — ASPIRIN 81 MG PO TBEC
81.0000 mg | DELAYED_RELEASE_TABLET | Freq: Every day | ORAL | Status: DC
  Administered 2022-08-20 – 2022-08-21 (×2): 81 mg via ORAL
  Filled 2022-08-19 (×2): qty 1

## 2022-08-19 MED ORDER — HEPARIN BOLUS VIA INFUSION
4000.0000 [IU] | Freq: Once | INTRAVENOUS | Status: AC
  Administered 2022-08-19: 4000 [IU] via INTRAVENOUS
  Filled 2022-08-19: qty 4000

## 2022-08-19 MED ORDER — ASPIRIN 300 MG RE SUPP: 300.0000 mg | RECTAL | Status: AC

## 2022-08-19 MED ORDER — DULOXETINE HCL 20 MG PO CPEP
20.0000 mg | ORAL_CAPSULE | Freq: Every day | ORAL | Status: DC
  Administered 2022-08-20 – 2022-08-21 (×2): 20 mg via ORAL
  Filled 2022-08-19 (×2): qty 1

## 2022-08-19 MED ORDER — METOPROLOL TARTRATE 25 MG PO TABS
25.0000 mg | ORAL_TABLET | Freq: Two times a day (BID) | ORAL | Status: DC
  Administered 2022-08-20 – 2022-08-21 (×3): 25 mg via ORAL
  Filled 2022-08-19 (×4): qty 1

## 2022-08-19 MED ORDER — SODIUM CHLORIDE 0.9% FLUSH
3.0000 mL | Freq: Two times a day (BID) | INTRAVENOUS | Status: DC
  Administered 2022-08-19 – 2022-08-21 (×4): 3 mL via INTRAVENOUS

## 2022-08-19 MED ORDER — ONDANSETRON HCL 4 MG/2ML IJ SOLN: 4.0000 mg | Freq: Four times a day (QID) | INTRAMUSCULAR | Status: DC | PRN

## 2022-08-19 NOTE — Progress Notes (Signed)
ANTICOAGULATION CONSULT NOTE - Initial Consult  Pharmacy Consult for heparin Indication: chest pain/ACS  No Known Allergies  Patient Measurements: Height: 5\' 11"  (180.3 cm) Weight: 122.5 kg (270 lb) IBW/kg (Calculated) : 75.3 Heparin Dosing Weight: 102.6 kg  Vital Signs: Temp: 97.8 F (36.6 C) (09/04 1445) Temp Source: Oral (09/04 1445) BP: 176/114 (09/04 1730) Pulse Rate: 94 (09/04 1730)  Labs: Recent Labs    08/19/22 1446  HGB 16.2  HCT 49.9  PLT 207  CREATININE 1.00  TROPONINIHS 140*    Estimated Creatinine Clearance: 95.5 mL/min (by C-G formula based on SCr of 1 mg/dL).   Medical History: Past Medical History:  Diagnosis Date   Arthritis    BPH (benign prostatic hypertrophy)    Feeling of incomplete bladder emptying    History of kidney stones    History of MRSA infection    2005--  left lower leg abscess w/ MRSA   Idiopathic peripheral neuropathy    Lower urinary tract symptoms (LUTS)    Wears glasses    Wears hearing aid    BILATERAL    Medications:  Scheduled:   Assessment: 66 yom presenting with chest pain. No AC PTA.   Hgb 16.2, plt 207, trop 140. No s/sx of bleeding on admission.  Goal of Therapy:  Heparin level 0.3-0.7 units/ml Monitor platelets by anticoagulation protocol: Yes   Plan:  Give 4000 units bolus x 1 Start heparin infusion at 1200 units/hr Check anti-Xa level in 6 hours and daily while on heparin Continue to monitor H&H and platelets  2006, PharmD, BCCCP Clinical Pharmacist  Phone: [phone] 08/19/2022 6:07 PM  Please check AMION for all [facility] Pharmacy phone numbers After 10:00 PM, call Main Pharmacy [phone]

## 2022-08-19 NOTE — ED Triage Notes (Signed)
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 Denies SHOB, n/v/d.  VSS.   324 ASA given PTA.

## 2022-08-19 NOTE — ED Provider Notes (Signed)
[facility] EMERGENCY DEPARTMENT Provider Note   CSN: [id number] Arrival date & time: 08/19/22  1420     History  Chief Complaint  Patient presents with   Chest Pain    Joshua Gomez is a 66 y.o. male.  66 year old male with past medical history that is significant for hyperlipidemia presents today for evaluation of substernal chest pressure associated with l diaphoresis.  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  Pressure was severe and lasted about 30 minutes before it subsided after he took a break.  He states that has been intermittent in severity since then.  He has had some pressure constantly.  Currently reports chest pain that is 5/10.  Denies shortness of breath, pain does not radiate to his arm, neck, or back.  Denies nausea, or lightheadedness.  Endorses history of CAD for his mom otherwise no significant family history of CAD.   The history is provided by the patient. No language interpreter was used.       Home Medications Prior to Admission medications   Medication Sig Start Date End Date Taking? Authorizing Provider  aspirin EC 81 MG tablet Take 81 mg by mouth daily.    [provider]  fluticasone (FLONASE) 50 MCG/ACT nasal spray Place 2 sprays into both nostrils daily. 06/16/15   [provider]  Glucosamine-MSM-Hyaluronic Acd (JOINT HEALTH PO) Take 2 tablets by mouth daily.    [provider]  [place] Lanius Ultra Strength 1500 MG CAPS Take 1 capsule by mouth daily.    [provider]  meloxicam (MOBIC) 15 MG tablet Take 15 mg by mouth daily.    [provider]  Oxycodone HCl 10 MG TABS Take 1 tablet (10 mg total) by mouth every 4 (four) hours as needed. 07/25/15   Ihor Gully, MD  phenazopyridine (PYRIDIUM) 200 MG tablet Take 1 tablet (200 mg total) by mouth 3 (three) times daily as needed for pain. 07/25/15   Ihor Gully, MD  pregabalin (LYRICA) 50 MG capsule  Take 50-100 mg by mouth 2 (two) times daily. 50mg  in the morning and 100mg  in the evening    [provider]      Allergies    Patient has no known allergies.    Review of Systems   Review of Systems  Physical Exam Updated Vital Signs BP (!) 167/98 (BP Location: Right Arm)   Pulse 69   Temp 97.8 F (36.6 C) (Oral)   Resp 18   SpO2 98%  Physical Exam Vitals and nursing note reviewed.  Constitutional:      General: He is not in acute distress.    Appearance: Normal appearance. He is not ill-appearing.  HENT:     Head: Normocephalic and atraumatic.     Nose: Nose normal.  Eyes:     General: No scleral icterus.    Extraocular Movements: Extraocular movements intact.     Conjunctiva/sclera: Conjunctivae normal.  Cardiovascular:     Rate and Rhythm: Normal rate and regular rhythm.     Pulses: Normal pulses.  Pulmonary:     Effort: Pulmonary effort is normal. No respiratory distress.     Breath sounds: Normal breath sounds. No wheezing or rales.  Abdominal:     General: There is no distension.     Tenderness: There is no abdominal tenderness.  Musculoskeletal:        General: Normal range of motion.     Cervical  back: Normal range of motion.     Right lower leg: No edema.     Left lower leg: No edema.  Skin:    General: Skin is warm and dry.  Neurological:     General: No focal deficit present.     Mental Status: He is alert. Mental status is at baseline.     ED Results / Procedures / Treatments   Labs (all labs ordered are listed, but only abnormal results are displayed) Labs Reviewed  BASIC METABOLIC PANEL - Abnormal; Notable for the following components:      Result Value   Glucose, Bld 126 (*)    All other components within normal limits  TROPONIN I (HIGH SENSITIVITY) - Abnormal; Notable for the following components:   Troponin I (High Sensitivity) 140 (*)    All other components within normal limits  CBC  TROPONIN I (HIGH SENSITIVITY)     EKG None  Radiology DG Chest 2 View  Result Date: 08/19/2022 CLINICAL DATA:  Shortness of breath EXAM: CHEST - 2 VIEW COMPARISON:  Radiograph 01/29/2008 FINDINGS: Borderline enlarged cardiac silhouette. Low lung volumes. Mild interstitial opacities. No pleural effusion. No pneumothorax. No acute osseous abnormality. Thoracic spondylosis. IMPRESSION: Borderline enlarged cardiac silhouette. Low lung volumes with prominent interstitium due to vascular crowding or interstitial edema. Electronically Signed   By: Caprice Renshaw M.D.   On: 08/19/2022 15:07    Procedures .Critical Care  Performed by: Marita Kansas, PA-C Authorized by: Marita Kansas, PA-C   Critical care provider statement:    Critical care time (minutes):  35   Critical care was necessary to treat or prevent imminent or life-threatening deterioration of the following conditions:  Cardiac failure   Critical care was time spent personally by me on the following activities:  Development of treatment plan with patient or surrogate, discussions with consultants, evaluation of patient's response to treatment, examination of patient, ordering and review of laboratory studies, ordering and review of radiographic studies, ordering and performing treatments and interventions, pulse oximetry, re-evaluation of patient's condition and review of old charts   Care discussed with: admitting provider       Medications Ordered in ED Medications  nitroGLYCERIN (NITROSTAT) SL tablet 0.4 mg (has no administration in time range)    ED Course/ Medical Decision Making/ A&P                           Medical Decision Making Risk Prescription drug management.   Medical Decision Making / ED Course   This patient presents to the ED for concern of chest pain, this involves an extensive number of treatment options, and is a complaint that carries with it a high risk of complications and morbidity.  The differential diagnosis includes ACS, PE, pneumonia,  MSK pain, GERD  MDM: 66 year old male with past medical history significant for hyperlipidemia not on medication presents today for evaluation of chest pressure associated with diaphoresis that has been ongoing since about 1030 this morning.  Initial troponin of 140.  EKG without STEMI, or acute ischemic changes.  Patient was given 324 mg of aspirin at the urgent care prior to arrival to the emergency room.  Will provide nitro.  Heparin per ACS protocol ordered.  Will discuss with cardiology.  Low suspicion for PE given he is without shortness of breath, hypoxia, tachycardia, or tachypnea.  Without unilateral leg swelling.  Discussed with cardiology.  They will admit patient to cardiology service.  Given  elevated BP ongoing chest pain they recommend nitro drip.  Nitro drip ordered.  Additional history obtained: -Additional history obtained from recent PCP visit where patient was evaluated for elevated blood pressure reading.  Not on any antihypertensive. -External records from outside source obtained and reviewed including: Chart review including previous notes, labs, imaging, consultation notes   Lab Tests: -I ordered, reviewed, and interpreted labs.   The pertinent results include:   Labs Reviewed  BASIC METABOLIC PANEL - Abnormal; Notable for the following components:      Result Value   Glucose, Bld 126 (*)    All other components within normal limits  TROPONIN I (HIGH SENSITIVITY) - Abnormal; Notable for the following components:   Troponin I (High Sensitivity) 140 (*)    All other components within normal limits  CBC  TROPONIN I (HIGH SENSITIVITY)      EKG  EKG Interpretation  Date/Time:    Ventricular Rate:    PR Interval:    QRS Duration:   QT Interval:    QTC Calculation:   R Axis:     Text Interpretation:           Imaging Studies ordered: I ordered imaging studies including chest x-ray I independently visualized and interpreted imaging. I agree with the  radiologist interpretation   Medicines ordered and prescription drug management: Meds ordered this encounter  Medications   nitroGLYCERIN (NITROSTAT) SL tablet 0.4 mg    -I have reviewed the patients home medicines and have made adjustments as needed  Critical interventions Nitro drip, heparin  Consultations Obtained: I requested consultation with the cardiology,  and discussed lab and imaging findings as well as pertinent plan - they recommend: Nitro drip for BP control, they will admit patient to their service   Cardiac Monitoring: The patient was maintained on a cardiac monitor.  I personally viewed and interpreted the cardiac monitored which showed an underlying rhythm of: Sinus rhythm    Reevaluation: After the interventions noted above, I reevaluated the patient and found that they have :stayed the same  Co morbidities that complicate the patient evaluation  Past Medical History:  Diagnosis Date   Arthritis    BPH (benign prostatic hypertrophy)    Feeling of incomplete bladder emptying    History of kidney stones    History of MRSA infection    2005--  left lower leg abscess w/ MRSA   Idiopathic peripheral neuropathy    Lower urinary tract symptoms (LUTS)    Wears glasses    Wears hearing aid    BILATERAL      Dispostion: Patient discussed with cardiology who will evaluate patient for admission.  Final Clinical Impression(s) / ED Diagnoses Final diagnoses:  ACS (acute coronary syndrome) [facility])    Rx / DC Orders ED Discharge Orders     None         Marita Kansas, PA-C 08/19/22 1810    Ernie Avena, MD 08/20/22 0120

## 2022-08-19 NOTE — H&P (Signed)
Cardiology Admission History and Physical   Patient ID: Joshua Gomez MRN: [id number]; DOB: 1956/10/01   Admission date: 08/19/2022  PCP:  Patient, No Pcp Per   [facility] Providers Cardiologist:  None   New - Dr. Jacques Navy     Chief Complaint:  Chest pain, elevated trop  Patient Profile:   Joshua Gomez is a 66 y.o. male with possible diagnosis of hypertension and untreated HLD per ER report who is being seen 08/19/2022 for the evaluation of chest pain with elevated troponin.  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  Describes it as substernal, nonradiating, and intermittent since presentation.  Blood pressure is significantly elevated.  Diaphoresis associated, no shortness of breath, no nausea vomiting or diarrhea.  Patient was given 324 of aspirin.  ED PA has given patient nitroglycerin as well.  Patient does not drink or smoke.  No chest pain [place].  He had gone today in his truck to pick up corn which was ready for him and plan to have a gathering at his home for Labor [place] today.  Denies exertional shortness of breath.  No palpitations, PND, orthopnea, leg swelling.  No nausea, vomiting, diarrhea.  No fevers, chills, weight loss.  EKG normal sinus rhythm Telemetry sinus rhythm  Pertinent laboratory studies include potassium 3.7, creatinine 1, first troponin 140, normal CBC.   Past Medical History:  Diagnosis Date   Arthritis    BPH (benign prostatic hypertrophy)    Feeling of incomplete bladder emptying    History of kidney stones    History of MRSA infection    2005--  left lower leg abscess w/ MRSA   Idiopathic peripheral neuropathy    Lower urinary tract symptoms (LUTS)    Wears glasses    Wears hearing aid    BILATERAL    Past Surgical History:  Procedure Laterality Date    CYSTO/  BILATERAL RETROGRADE PYELOGRAM  12-02-2005   TRANSURETHRAL RESECTION OF PROSTATE N/A 07/24/2015   Procedure: TRANSURETHRAL RESECTION OF THE PROSTATE (TURP) WITH GYRUS;  Surgeon: Ihor Gully, MD;  Location: [facility];  Service: Urology;  Laterality: N/A;     Medications Prior to Admission: Prior to Admission medications   Medication Sig Start Date End Date Taking? Authorizing Provider  ascorbic acid (VITAMIN C) 500 MG tablet Take 500 mg by mouth daily.   Yes [provider]  aspirin EC 81 MG tablet Take 81 mg by mouth daily.   Yes [provider]  celecoxib (CELEBREX) 200 MG capsule Take 200 mg by mouth daily. 07/22/22  Yes [provider]  DULoxetine (CYMBALTA) 20 MG capsule Take 20 mg by mouth daily. 06/28/22  Yes [provider]  fluticasone (FLONASE) 50 MCG/ACT nasal spray Place 2 sprays into both nostrils daily as needed for allergies. 06/16/15  Yes [provider]  Multiple Vitamin (MULTIVITAMIN ADULT PO) Take 1 tablet by mouth daily.   Yes [provider]  rosuvastatin (CRESTOR) 5 MG tablet Take 5 mg by mouth daily. 06/17/22  Yes [provider]  buPROPion (WELLBUTRIN SR) 150 MG 12 hr tablet Take 150 mg by mouth daily. 08/16/22   [provider]  Oxycodone HCl 10 MG TABS Take 1 tablet (10 mg total) by mouth every 4 (four) hours as needed.  Patient not taking: Reported on 08/19/2022 07/25/15   Ihor Gully, MD  phenazopyridine (PYRIDIUM) 200 MG tablet Take 1 tablet (200 mg total) by mouth 3 (three) times daily as needed for pain. Patient not taking: Reported on 08/19/2022 07/25/15   Ihor Gully, MD     Allergies:   No Known Allergies  Social History:   Social History   Socioeconomic History   Marital status: Married    Spouse name: Not on file   Number of children: Not on file   Years of education: Not on file   Highest education level: Not on file  Occupational History   Not on file  Tobacco  Use   Smoking status: Never   Smokeless tobacco: Never  Substance and Sexual Activity   Alcohol use: No   Drug use: No   Sexual activity: Not on file  Other Topics Concern   Not on file  Social History Narrative   Not on file   Social Determinants of Health   Financial Resource Strain: Not on file  Food Insecurity: Not on file  Transportation Needs: Not on file  Physical Activity: Not on file  Stress: Not on file  Social Connections: Not on file  Intimate Partner Violence: Not on file    Family History:   The patient's family history is not on file.    ROS:  Please see the history of present illness.  All other ROS reviewed and negative.     Physical Exam/Data:   Vitals:   08/19/22 1445 08/19/22 1715 08/19/22 1730 08/19/22 1803  BP: (!) 167/98 (!) 178/117 (!) 176/114   Pulse: 69 93 94   Resp: 18  17   Temp: 97.8 F (36.6 C)     TempSrc: Oral     SpO2: 98% 99% 98%   Weight:    122.5 kg  Height:    5\' 11"  (1.803 m)   No intake or output data in the 24 hours ending 08/19/22 1809    08/19/2022    6:03 PM 07/24/2015   11:43 AM 07/24/2015    7:26 AM  Last 3 Weights  Weight (lbs) 270 lb 290 lb 290 lb  Weight (kg) 122.471 kg 131.543 kg 131.543 kg     Body mass index is 37.66 kg/m.  Constitutional: No acute distress Eyes: sclera non-icteric, normal conjunctiva and lids ENMT: normal dentition, moist mucous membranes Cardiovascular: regular rhythm, normal rate, no murmur. S1 and S2 normal. No jugular venous distention.  Respiratory: clear to auscultation bilaterally GI : normal bowel sounds, soft and nontender. No distention.   MSK: extremities warm, well perfused. No edema.  Lower extremity skin is yellow in color appears somewhat jaundiced. NEURO: grossly nonfocal exam, moves all extremities. PSYCH: alert and oriented x 3, normal mood and affect.   EKG:  The ECG that was done 08/19/2022 was personally reviewed and demonstrates sinus rhythm heart rate 65  Relevant CV  Studies: None  Laboratory Data:  High Sensitivity Troponin:   Recent Labs  Lab 08/19/22 1446  TROPONINIHS 140*      Chemistry Recent Labs  Lab 08/19/22 1446  NA 142  K 3.7  CL 107  CO2 23  GLUCOSE 126*  BUN 20  CREATININE 1.00  CALCIUM 9.6  GFRNONAA >60  ANIONGAP 12    No results for input(s): "PROT", "ALBUMIN", "AST", "ALT", "ALKPHOS", "BILITOT" in the last 168 hours. Lipids No results for input(s): "CHOL", "TRIG", "HDL", "LABVLDL", "LDLCALC", "CHOLHDL" in the last 168 hours. Hematology  Recent Labs  Lab 08/19/22 1446  WBC 10.1  RBC 5.05  HGB 16.2  HCT 49.9  MCV 98.8  MCH 32.1  MCHC 32.5  RDW 12.0  PLT 207   Thyroid No results for input(s): "TSH", "FREET4" in the last 168 hours. BNPNo results for input(s): "BNP", "PROBNP" in the last 168 hours.  DDimer No results for input(s): "DDIMER" in the last 168 hours.   Radiology/Studies:  DG Chest 2 View  Result Date: 08/19/2022 CLINICAL DATA:  Shortness of breath EXAM: CHEST - 2 VIEW COMPARISON:  Radiograph 01/29/2008 FINDINGS: Borderline enlarged cardiac silhouette. Low lung volumes. Mild interstitial opacities. No pleural effusion. No pneumothorax. No acute osseous abnormality. Thoracic spondylosis. IMPRESSION: Borderline enlarged cardiac silhouette. Low lung volumes with prominent interstitium due to vascular crowding or interstitial edema. Electronically Signed   By: Caprice Renshaw M.D.   On: 08/19/2022 15:07     Assessment and Plan:   Principal Problem:   Non-ST elevation (NSTEMI) myocardial infarction [facility]) Active Problems:   HTN (hypertension)   HLD (hyperlipidemia)  NSTEMI - first trop 140, EKG normal - plan for cardiac cath tomorrow for typical angina - IV heparin - IV nitro for continued intermittent nitro responsive chest pain and BP elevation - HTN while in ED responsive to nitro, nitro gtt - legs look jaundiced. Check lfts, if normal, start statin. - Asa 81 mg daily  INFORMED CONSENT: I have  reviewed the risks, indications, and alternatives to cardiac catheterization, possible angioplasty, and stenting with the patient. Risks include but are not limited to bleeding, infection, vascular injury, stroke, myocardial infarction, arrhythmia, kidney injury, radiation-related injury in the case of prolonged fluoroscopy use, emergency cardiac surgery, and death. The patient understands the risks of serious complication is 1-2 in 1000 with diagnostic cardiac cath and 1-2% or less with angioplasty/stenting.     Risk Assessment/Risk Scores:    TIMI Risk Score for Unstable Angina or Non-ST Elevation MI:   The patient's TIMI risk score is 3, which indicates a 13% risk of all cause mortality, new or recurrent myocardial infarction or need for urgent revascularization in the next 14 days.       Severity of Illness: The appropriate patient status for this patient is INPATIENT. Inpatient status is judged to be reasonable and necessary in order to provide the required intensity of service to ensure the patient's safety. The patient's presenting symptoms, physical exam findings, and initial radiographic and laboratory data in the context of their chronic comorbidities is felt to place them at high risk for further clinical deterioration. Furthermore, it is not anticipated that the patient will be medically stable for discharge from the hospital within 2 midnights of admission.   * I certify that at the point of admission it is my clinical judgment that the patient will require inpatient hospital care spanning beyond 2 midnights from the point of admission due to high intensity of service, high risk for further deterioration and high frequency of surveillance required.*   For questions or updates, please contact [facility] Please consult [email or web address] for contact info under     Signed, Parke Poisson, MD  08/19/2022 6:09 PM

## 2022-08-19 NOTE — ED Provider Triage Note (Signed)
Emergency Medicine Provider Triage Evaluation Note  Joshua Gomez , a 66 y.o. male  was evaluated in triage.  Pt complains of chest pain. Starting this morning, central without radiation. Constant, given nitro and aspirin by EMS which helped a little bit.  No history of previous ACS per patient..  Review of Systems  Per HPI  Physical Exam  There were no vitals taken for this visit. Gen:   Awake, no distress   Resp:  Normal effort  MSK:   Moves extremities without difficulty  Other:  S1-S2, radial pulse 2+ symmetric bilaterally  Medical Decision Making  Medically screening exam initiated at 2:41 PM.  Appropriate orders placed.  Joshua Gomez was informed that the remainder of the evaluation will be completed by another provider, this initial triage assessment does not replace that evaluation, and the importance of remaining in the ED until their evaluation is complete.     Theron Arista, PA-C 08/19/22 1442

## 2022-08-20 ENCOUNTER — Inpatient Hospital Stay (HOSPITAL_COMMUNITY)
Admission: EM | Disposition: A | Discharge status: Home / Self Care | Payer: Self-pay | Source: Home / Self Care | Attending: Internal Medicine

## 2022-08-20 ENCOUNTER — Inpatient Hospital Stay (HOSPITAL_COMMUNITY): Payer: Medicare Other

## 2022-08-20 DIAGNOSIS — I2510 Atherosclerotic heart disease of native coronary artery without angina pectoris: Secondary | ICD-10-CM

## 2022-08-20 DIAGNOSIS — R079 Chest pain, unspecified: Secondary | ICD-10-CM

## 2022-08-20 HISTORY — PX: LEFT HEART CATH AND CORONARY ANGIOGRAPHY: CATH118249

## 2022-08-20 HISTORY — PX: CORONARY STENT INTERVENTION: CATH118234

## 2022-08-20 LAB — CBC
HCT: 44.9 % (ref 39.0–52.0)
Hemoglobin: 14.9 g/dL (ref 13.0–17.0)
MCH: 31.9 pg (ref 26.0–34.0)
MCHC: 33.2 g/dL (ref 30.0–36.0)
MCV: 96.1 fL (ref 80.0–100.0)
Platelets: 212 10*3/uL (ref 150–400)
RBC: 4.67 MIL/uL (ref 4.22–5.81)
RDW: 12.1 % (ref 11.5–15.5)
WBC: 11.7 10*3/uL — ABNORMAL HIGH (ref 4.0–10.5)
nRBC: 0 % (ref 0.0–0.2)

## 2022-08-20 LAB — HEMOGLOBIN A1C
Hgb A1c MFr Bld: 5.2 % (ref 4.8–5.6)
Mean Plasma Glucose: 102.54 mg/dL

## 2022-08-20 LAB — ECHOCARDIOGRAM COMPLETE
Area-P 1/2: 3.37 cm2
Height: 71 in
MV M vel: 2.94 m/s
MV Peak grad: 34.6 mmHg
S' Lateral: 3.7 cm
Weight: 4320 oz

## 2022-08-20 LAB — POCT ACTIVATED CLOTTING TIME
Activated Clotting Time: 293 seconds
Activated Clotting Time: 275 seconds
Activated Clotting Time: 305 seconds

## 2022-08-20 LAB — HEPARIN LEVEL (UNFRACTIONATED)
Heparin Unfractionated: 0.18 IU/mL — ABNORMAL LOW (ref 0.30–0.70)
Heparin Unfractionated: 0.35 IU/mL (ref 0.30–0.70)

## 2022-08-20 LAB — HIV ANTIBODY (ROUTINE TESTING W REFLEX): HIV Screen 4th Generation wRfx: NONREACTIVE

## 2022-08-20 SURGERY — LEFT HEART CATH AND CORONARY ANGIOGRAPHY
Anesthesia: LOCAL

## 2022-08-20 MED ORDER — NITROGLYCERIN 1 MG/10 ML FOR IR/CATH LAB
INTRA_ARTERIAL | Status: DC | PRN
  Administered 2022-08-20 (×2): 150 ug via INTRACORONARY

## 2022-08-20 MED ORDER — SODIUM CHLORIDE 0.9% FLUSH
3.0000 mL | Freq: Two times a day (BID) | INTRAVENOUS | Status: DC
  Administered 2022-08-21: 3 mL via INTRAVENOUS

## 2022-08-20 MED ORDER — PERFLUTREN LIPID MICROSPHERE
1.0000 mL | INTRAVENOUS | Status: AC | PRN
  Administered 2022-08-20: 2 mL via INTRAVENOUS

## 2022-08-20 MED ORDER — ASPIRIN 81 MG PO CHEW: 81.0000 mg | CHEWABLE_TABLET | ORAL | Status: DC

## 2022-08-20 MED ORDER — LIDOCAINE HCL (PF) 1 % IJ SOLN
INTRAMUSCULAR | Status: AC
  Filled 2022-08-20: qty 30

## 2022-08-20 MED ORDER — HEPARIN (PORCINE) IN NACL 1000-0.9 UT/500ML-% IV SOLN
INTRAVENOUS | Status: AC
  Filled 2022-08-20: qty 1000

## 2022-08-20 MED ORDER — SODIUM CHLORIDE 0.9 % IV SOLN: INTRAVENOUS | Status: AC

## 2022-08-20 MED ORDER — HEPARIN SODIUM (PORCINE) 1000 UNIT/ML IJ SOLN
INTRAMUSCULAR | Status: AC
  Filled 2022-08-20: qty 10

## 2022-08-20 MED ORDER — SODIUM CHLORIDE 0.9 % WEIGHT BASED INFUSION: 1.0000 mL/kg/h | INTRAVENOUS | Status: DC

## 2022-08-20 MED ORDER — SODIUM CHLORIDE 0.9 % WEIGHT BASED INFUSION: 3.0000 mL/kg/h | INTRAVENOUS | Status: DC

## 2022-08-20 MED ORDER — HEPARIN (PORCINE) IN NACL 1000-0.9 UT/500ML-% IV SOLN
INTRAVENOUS | Status: AC
  Filled 2022-08-20: qty 500

## 2022-08-20 MED ORDER — FENTANYL CITRATE (PF) 100 MCG/2ML IJ SOLN
INTRAMUSCULAR | Status: AC
  Filled 2022-08-20: qty 2

## 2022-08-20 MED ORDER — MIDAZOLAM HCL 2 MG/2ML IJ SOLN
INTRAMUSCULAR | Status: DC | PRN
  Administered 2022-08-20: 1 mg via INTRAVENOUS
  Administered 2022-08-20: 2 mg via INTRAVENOUS
  Administered 2022-08-20: 1 mg via INTRAVENOUS

## 2022-08-20 MED ORDER — IOHEXOL 350 MG/ML SOLN
INTRAVENOUS | Status: DC | PRN
  Administered 2022-08-20: 180 mL

## 2022-08-20 MED ORDER — MIDAZOLAM HCL 2 MG/2ML IJ SOLN
INTRAMUSCULAR | Status: AC
  Filled 2022-08-20: qty 2

## 2022-08-20 MED ORDER — VERAPAMIL HCL 2.5 MG/ML IV SOLN
INTRAVENOUS | Status: AC
  Filled 2022-08-20: qty 2

## 2022-08-20 MED ORDER — SODIUM CHLORIDE 0.9 % IV SOLN: 250.0000 mL | INTRAVENOUS | Status: DC | PRN

## 2022-08-20 MED ORDER — VERAPAMIL HCL 2.5 MG/ML IV SOLN
INTRAVENOUS | Status: DC | PRN
  Administered 2022-08-20: 2 mg via INTRA_ARTERIAL

## 2022-08-20 MED ORDER — HEPARIN SODIUM (PORCINE) 1000 UNIT/ML IJ SOLN
INTRAMUSCULAR | Status: DC | PRN
  Administered 2022-08-20 (×2): 6000 [IU] via INTRAVENOUS
  Administered 2022-08-20: 3000 [IU] via INTRAVENOUS

## 2022-08-20 MED ORDER — TICAGRELOR 90 MG PO TABS
90.0000 mg | ORAL_TABLET | Freq: Two times a day (BID) | ORAL | Status: DC
  Administered 2022-08-21 (×2): 90 mg via ORAL
  Filled 2022-08-20 (×2): qty 1

## 2022-08-20 MED ORDER — LIDOCAINE HCL (PF) 1 % IJ SOLN
INTRAMUSCULAR | Status: DC | PRN
  Administered 2022-08-20: 2 mL

## 2022-08-20 MED ORDER — HEPARIN BOLUS VIA INFUSION
3000.0000 [IU] | Freq: Once | INTRAVENOUS | Status: AC
  Administered 2022-08-20: 3000 [IU] via INTRAVENOUS
  Filled 2022-08-20: qty 3000

## 2022-08-20 MED ORDER — HEPARIN (PORCINE) IN NACL 1000-0.9 UT/500ML-% IV SOLN
INTRAVENOUS | Status: DC | PRN
  Administered 2022-08-20 (×2): 500 mL

## 2022-08-20 MED ORDER — ROSUVASTATIN CALCIUM 20 MG PO TABS
20.0000 mg | ORAL_TABLET | Freq: Every day | ORAL | Status: DC
  Administered 2022-08-20 – 2022-08-21 (×2): 20 mg via ORAL
  Filled 2022-08-20 (×2): qty 1

## 2022-08-20 MED ORDER — HYDRALAZINE HCL 20 MG/ML IJ SOLN: 10.0000 mg | INTRAMUSCULAR | Status: AC | PRN

## 2022-08-20 MED ORDER — VERAPAMIL HCL 2.5 MG/ML IV SOLN
INTRAVENOUS | Status: DC | PRN
  Administered 2022-08-20 (×2): 200 ug via INTRACORONARY

## 2022-08-20 MED ORDER — VERAPAMIL HCL 2.5 MG/ML IV SOLN
INTRAVENOUS | Status: DC | PRN
  Administered 2022-08-20: 10 mL via INTRA_ARTERIAL

## 2022-08-20 MED ORDER — NITROGLYCERIN 1 MG/10 ML FOR IR/CATH LAB
INTRA_ARTERIAL | Status: AC
Start: 2022-08-20 — End: ?
  Filled 2022-08-20: qty 10

## 2022-08-20 MED ORDER — LABETALOL HCL 5 MG/ML IV SOLN: 10.0000 mg | INTRAVENOUS | Status: AC | PRN

## 2022-08-20 MED ORDER — FENTANYL CITRATE (PF) 100 MCG/2ML IJ SOLN
INTRAMUSCULAR | Status: DC | PRN
  Administered 2022-08-20 (×3): 25 ug via INTRAVENOUS

## 2022-08-20 MED ORDER — TICAGRELOR 90 MG PO TABS
ORAL_TABLET | ORAL | Status: DC | PRN
  Administered 2022-08-20: 180 mg via ORAL

## 2022-08-20 MED ORDER — SODIUM CHLORIDE 0.9% FLUSH
3.0000 mL | INTRAVENOUS | Status: DC | PRN
Start: 2022-08-20 — End: 2022-08-21

## 2022-08-20 SURGICAL SUPPLY — 20 items
BALL SAPPHIRE NC24 3.25X15 (BALLOONS) ×1
BALLN SAPPHIRE 2.5X15 (BALLOONS) ×1
BALLOON SAPPHIRE 2.5X15 (BALLOONS) IMPLANT
BALLOON SAPPHIRE NC24 3.25X15 (BALLOONS) IMPLANT
CATH 5FR JL3.5 JR4 ANG PIG MP (CATHETERS) IMPLANT
CATH VISTA GUIDE 6FR XBLAD3.5 (CATHETERS) IMPLANT
CATH VISTA GUIDE 6FR XBLAD4 (CATHETERS) IMPLANT
DEVICE RAD COMP TR BAND LRG (VASCULAR PRODUCTS) IMPLANT
GLIDESHEATH SLEND SS 6F .021 (SHEATH) IMPLANT
GUIDEWIRE INQWIRE 1.5J.035X260 (WIRE) IMPLANT
INQWIRE 1.5J .035X260CM (WIRE) ×1
KIT ENCORE 26 ADVANTAGE (KITS) IMPLANT
KIT HEART LEFT (KITS) ×1 IMPLANT
PACK CARDIAC CATHETERIZATION (CUSTOM PROCEDURE TRAY) ×1 IMPLANT
SHEATH 6FR 85 DEST SLENDER (SHEATH) IMPLANT
STENT ONYX FRONTIER 3.0X38 (Permanent Stent) IMPLANT
TRANSDUCER W/STOPCOCK (MISCELLANEOUS) ×1 IMPLANT
TUBING CIL FLEX 10 FLL-RA (TUBING) ×1 IMPLANT
WIRE COUGAR XT STRL 190CM (WIRE) IMPLANT
WIRE RUNTHROUGH IZANAI 014 180 (WIRE) IMPLANT

## 2022-08-20 NOTE — Interval H&P Note (Signed)
Cath Lab Visit (complete for each Cath Lab visit)  Clinical Evaluation Leading to the Procedure:   ACS: Yes.    Non-ACS:    Anginal Classification: CCS IV  Anti-ischemic medical therapy: Minimal Therapy (1 class of medications)  Non-Invasive Test Results: No non-invasive testing performed  Prior CABG: No previous CABG      History and Physical Interval Note:  08/20/2022 3:21 PM  Joshua Gomez  has presented today for surgery, with the diagnosis of NSTEMI.  The various methods of treatment have been discussed with the patient and family. After consideration of risks, benefits and other options for treatment, the patient has consented to  Procedure(s): LEFT HEART CATH AND CORONARY ANGIOGRAPHY (N/A) as a surgical intervention.  The patient's history has been reviewed, patient examined, no change in status, stable for surgery.  I have reviewed the patient's chart and labs.  Questions were answered to the patient's satisfaction.     Tonny Bollman

## 2022-08-20 NOTE — Progress Notes (Signed)
ANTICOAGULATION CONSULT NOTE - Follow Up Consult  Pharmacy Consult for heparin Indication: chest pain/ACS  No Known Allergies  Patient Measurements: Height: 5\' 11"  (180.3 cm) Weight: 122.5 kg (270 lb) IBW/kg (Calculated) : 75.3 Heparin Dosing Weight: 102.6 kg  Vital Signs: Temp: 97.9 F (36.6 C) (09/05 1045) Temp Source: Oral (09/05 1045) BP: 129/89 (09/05 1115) Pulse Rate: 56 (09/05 1115)  Labs: Recent Labs    08/19/22 1446 08/19/22 1723 08/20/22 0457 08/20/22 1315  HGB 16.2  --   --  14.9  HCT 49.9  --   --  44.9  PLT 207  --   --  212  HEPARINUNFRC  --   --  0.18* 0.35  CREATININE 1.00  --   --   --   TROPONINIHS 140* 501*  --   --      Estimated Creatinine Clearance: 95.5 mL/min (by C-G formula based on SCr of 1 mg/dL).   Medical History: Past Medical History:  Diagnosis Date   Arthritis    BPH (benign prostatic hypertrophy)    Feeling of incomplete bladder emptying    History of kidney stones    History of MRSA infection    2005--  left lower leg abscess w/ MRSA   Idiopathic peripheral neuropathy    Lower urinary tract symptoms (LUTS)    Wears glasses    Wears hearing aid    BILATERAL    Medications:  Scheduled:   aspirin  81 mg Oral Pre-Cath   aspirin EC  81 mg Oral Daily   DULoxetine  20 mg Oral Daily   metoprolol tartrate  25 mg Oral BID   rosuvastatin  20 mg Oral Daily   sodium chloride flush  3 mL Intravenous Q12H    Assessment: 66 yom presenting with chest pain. No AC PTA.   Heparin level of 0.35 is therapeutic on heparin 1500 units/hr. CBC ok. Plan for cath today.   Goal of Therapy:  Heparin level 0.3-0.7 units/ml Monitor platelets by anticoagulation protocol: Yes   Plan:  Continue heparin 1500 units/hr Check 6 hr confirmatory heparin level  Follow up cath plans   2006, PharmD, BCPS Clinical Pharmacist 08/20/2022 2:54 PM

## 2022-08-20 NOTE — Progress Notes (Signed)
ANTICOAGULATION CONSULT NOTE - Follow Up Consult  Pharmacy Consult for heparin Indication:  NSTEMI  Labs: Recent Labs    08/19/22 1446 08/19/22 1723 08/20/22 0457  HGB 16.2  --   --   HCT 49.9  --   --   PLT 207  --   --   HEPARINUNFRC  --   --  0.18*  CREATININE 1.00  --   --   TROPONINIHS 140* 501*  --     Assessment: 66yo male subtherapeutic on heparin with initial dosing for NSTEMI; no infusion issues or signs of bleeding per RN.  Goal of Therapy:  Heparin level 0.3-0.7 units/ml   Plan:  Will rebolus with heparin 3000 units and increase heparin infusion by 3 units/kg/hr to 1500 units/hr and check level in 6 hours.    Vernard Gambles, PharmD, BCPS  08/20/2022,5:44 AM

## 2022-08-20 NOTE — Progress Notes (Addendum)
Rounding Note    Patient Name: Joshua Gomez Date of Encounter: 08/20/2022  [facility] Cardiologist: None   Subjective   No complaints this morning. No chest pain.   Inpatient Medications    Scheduled Meds:  aspirin EC  81 mg Oral Daily   DULoxetine  20 mg Oral Daily   metoprolol tartrate  25 mg Oral BID   rosuvastatin  5 mg Oral Daily   sodium chloride flush  3 mL Intravenous Q12H   Continuous Infusions:  sodium chloride 10 mL/hr at 08/20/22 0438   heparin 1,500 Units/hr (08/20/22 0832)   nitroGLYCERIN 5 mcg/min (08/20/22 0438)   PRN Meds: acetaminophen, ALPRAZolam, nitroGLYCERIN, nitroGLYCERIN, ondansetron (ZOFRAN) IV   Vital Signs    Vitals:   08/20/22 0730 08/20/22 0745 08/20/22 0800 08/20/22 0815  BP: 135/80 132/85 (!) 148/87 (!) 148/95  Pulse: 63 80 63 63  Resp: 16 20 13 16   Temp:      TempSrc:      SpO2: 94% 96% 94% 97%  Weight:      Height:        Intake/Output Summary (Last 24 hours) at 08/20/2022 0845 Last data filed at 08/20/2022 0438 Gross per 24 hour  Intake 222.13 ml  Output --  Net 222.13 ml      08/19/2022    6:03 PM 07/24/2015   11:43 AM 07/24/2015    7:26 AM  Last 3 Weights  Weight (lbs) 270 lb 290 lb 290 lb  Weight (kg) 122.471 kg 131.543 kg 131.543 kg      Telemetry    Sinus Rhythm, PVCs, short 3-4 beat runs of NSVT - Personally Reviewed  ECG    No new tracing this morning  Physical Exam   GEN: No acute distress.   Neck: No JVD Cardiac: RRR, no murmurs, rubs, or gallops.  Respiratory: Clear to auscultation bilaterally. GI: Soft, nontender, non-distended  MS: No edema; No deformity. Neuro:  Nonfocal  Psych: Normal affect   Labs    High Sensitivity Troponin:   Recent Labs  Lab 08/19/22 1446 08/19/22 1723  TROPONINIHS 140* 501*     Chemistry Recent Labs  Lab 08/19/22 1446 08/19/22 2243  NA 142  --   K 3.7  --   CL 107  --   CO2 23  --   GLUCOSE 126*  --   BUN 20  --   CREATININE 1.00  --    CALCIUM 9.6  --   MG  --  1.9  PROT  --  6.0*  ALBUMIN  --  3.7  AST  --  43*  ALT  --  21  ALKPHOS  --  29*  BILITOT  --  0.8  GFRNONAA >60  --   ANIONGAP 12  --     Lipids No results for input(s): "CHOL", "TRIG", "HDL", "LABVLDL", "LDLCALC", "CHOLHDL" in the last 168 hours.  Hematology Recent Labs  Lab 08/19/22 1446  WBC 10.1  RBC 5.05  HGB 16.2  HCT 49.9  MCV 98.8  MCH 32.1  MCHC 32.5  RDW 12.0  PLT 207   Thyroid  Recent Labs  Lab 08/19/22 2243  TSH 0.772  FREET4 0.80    BNPNo results for input(s): "BNP", "PROBNP" in the last 168 hours.  DDimer No results for input(s): "DDIMER" in the last 168 hours.   Radiology    DG Chest 2 View  Result Date: 08/19/2022 CLINICAL DATA:  Shortness of breath EXAM: CHEST - 2  VIEW COMPARISON:  Radiograph 01/29/2008 FINDINGS: Borderline enlarged cardiac silhouette. Low lung volumes. Mild interstitial opacities. No pleural effusion. No pneumothorax. No acute osseous abnormality. Thoracic spondylosis. IMPRESSION: Borderline enlarged cardiac silhouette. Low lung volumes with prominent interstitium due to vascular crowding or interstitial edema. Electronically Signed   By: Caprice Renshaw M.D.   On: 08/19/2022 15:07    Cardiac Studies   Echo: pending  Patient Profile     66 y.o. male with possible diagnosis of hypertension and untreated HLD per ER report who was seen 08/19/2022 for the evaluation of chest pain with elevated troponin.  Assessment & Plan    NSTEMI -- High-sensitivity troponin peaked at 501.  Remains on IV heparin as well as nitroglycerin and pain-free.  Plan for cardiac catheterization today. -- Continue IV heparin, IV nitro, aspirin, metoprolol 25 mg twice daily, further increase Crestor to 20 mg daily -- Echo pending  Shared Decision Making/Informed Consent{ The risks [stroke (1 in 1000), death (1 in 1000), kidney failure [usually temporary] (1 in 500), bleeding (1 in 200), allergic reaction [possibly serious] (1 in  200)], benefits (diagnostic support and management of coronary artery disease) and alternatives of a cardiac catheterization were discussed in detail with Mr. Glennon and he is willing to proceed.  Hypertension --Borderline elevated, though has not received his metoprolol --Continue metoprolol 25 mg twice daily may need further adjustments pending response  Hyperlipidemia -- Lipid panel pending -- Crestor increased to 20 mg daily -- Will need FLP/LFTs in 8 weeks   For questions or updates, please contact [facility] Please consult [email or web address] for contact info under        Signed, Laverda Page, NP  08/20/2022, 8:45 AM    I have examined the patient and reviewed assessment and plan and discussed with patient.  Agree with above as stated.  NSTEMI based on symptoms and enzymes.  ECG unremarkable.   All questions about cardiac cath answered.  Echo also ordered for today.  Further plans based on results.   Lance Muss

## 2022-08-20 NOTE — H&P (View-Only) (Signed)
 Rounding Note    Patient Name: Joshua Gomez Date of Encounter: 08/20/2022  [facility] Cardiologist: None   Subjective   No complaints this morning. No chest pain.   Inpatient Medications    Scheduled Meds:  aspirin EC  81 mg Oral Daily   DULoxetine  20 mg Oral Daily   metoprolol tartrate  25 mg Oral BID   rosuvastatin  5 mg Oral Daily   sodium chloride flush  3 mL Intravenous Q12H   Continuous Infusions:  sodium chloride 10 mL/hr at 08/20/22 0438   heparin 1,500 Units/hr (08/20/22 0832)   nitroGLYCERIN 5 mcg/min (08/20/22 0438)   PRN Meds: acetaminophen, ALPRAZolam, nitroGLYCERIN, nitroGLYCERIN, ondansetron (ZOFRAN) IV   Vital Signs    Vitals:   08/20/22 0730 08/20/22 0745 08/20/22 0800 08/20/22 0815  BP: 135/80 132/85 (!) 148/87 (!) 148/95  Pulse: 63 80 63 63  Resp: 16 20 13 16  Temp:      TempSrc:      SpO2: 94% 96% 94% 97%  Weight:      Height:        Intake/Output Summary (Last 24 hours) at 08/20/2022 0845 Last data filed at 08/20/2022 0438 Gross per 24 hour  Intake 222.13 ml  Output --  Net 222.13 ml      08/19/2022    6:03 PM 07/24/2015   11:43 AM 07/24/2015    7:26 AM  Last 3 Weights  Weight (lbs) 270 lb 290 lb 290 lb  Weight (kg) 122.471 kg 131.543 kg 131.543 kg      Telemetry    Sinus Rhythm, PVCs, short 3-4 beat runs of NSVT - Personally Reviewed  ECG    No new tracing this morning  Physical Exam   GEN: No acute distress.   Neck: No JVD Cardiac: RRR, no murmurs, rubs, or gallops.  Respiratory: Clear to auscultation bilaterally. GI: Soft, nontender, non-distended  MS: No edema; No deformity. Neuro:  Nonfocal  Psych: Normal affect   Labs    High Sensitivity Troponin:   Recent Labs  Lab 08/19/22 1446 08/19/22 1723  TROPONINIHS 140* 501*     Chemistry Recent Labs  Lab 08/19/22 1446 08/19/22 2243  NA 142  --   K 3.7  --   CL 107  --   CO2 23  --   GLUCOSE 126*  --   BUN 20  --   CREATININE 1.00  --    CALCIUM 9.6  --   MG  --  1.9  PROT  --  6.0*  ALBUMIN  --  3.7  AST  --  43*  ALT  --  21  ALKPHOS  --  29*  BILITOT  --  0.8  GFRNONAA >60  --   ANIONGAP 12  --     Lipids No results for input(s): "CHOL", "TRIG", "HDL", "LABVLDL", "LDLCALC", "CHOLHDL" in the last 168 hours.  Hematology Recent Labs  Lab 08/19/22 1446  WBC 10.1  RBC 5.05  HGB 16.2  HCT 49.9  MCV 98.8  MCH 32.1  MCHC 32.5  RDW 12.0  PLT 207   Thyroid  Recent Labs  Lab 08/19/22 2243  TSH 0.772  FREET4 0.80    BNPNo results for input(s): "BNP", "PROBNP" in the last 168 hours.  DDimer No results for input(s): "DDIMER" in the last 168 hours.   Radiology    DG Chest 2 View  Result Date: 08/19/2022 CLINICAL DATA:  Shortness of breath EXAM: CHEST - 2   VIEW COMPARISON:  Radiograph 01/29/2008 FINDINGS: Borderline enlarged cardiac silhouette. Low lung volumes. Mild interstitial opacities. No pleural effusion. No pneumothorax. No acute osseous abnormality. Thoracic spondylosis. IMPRESSION: Borderline enlarged cardiac silhouette. Low lung volumes with prominent interstitium due to vascular crowding or interstitial edema. Electronically Signed   By: Caprice Renshaw M.D.   On: 08/19/2022 15:07    Cardiac Studies   Echo: pending  Patient Profile     66 y.o. male with possible diagnosis of hypertension and untreated HLD per ER report who was seen 08/19/2022 for the evaluation of chest pain with elevated troponin.  Assessment & Plan    NSTEMI -- High-sensitivity troponin peaked at 501.  Remains on IV heparin as well as nitroglycerin and pain-free.  Plan for cardiac catheterization today. -- Continue IV heparin, IV nitro, aspirin, metoprolol 25 mg twice daily, further increase Crestor to 20 mg daily -- Echo pending  Shared Decision Making/Informed Consent{ The risks [stroke (1 in 1000), death (1 in 1000), kidney failure [usually temporary] (1 in 500), bleeding (1 in 200), allergic reaction [possibly serious] (1 in  200)], benefits (diagnostic support and management of coronary artery disease) and alternatives of a cardiac catheterization were discussed in detail with Mr. Glennon and he is willing to proceed.  Hypertension --Borderline elevated, though has not received his metoprolol --Continue metoprolol 25 mg twice daily may need further adjustments pending response  Hyperlipidemia -- Lipid panel pending -- Crestor increased to 20 mg daily -- Will need FLP/LFTs in 8 weeks   For questions or updates, please contact [facility] Please consult [email or web address] for contact info under        Signed, Laverda Page, NP  08/20/2022, 8:45 AM    I have examined the patient and reviewed assessment and plan and discussed with patient.  Agree with above as stated.  NSTEMI based on symptoms and enzymes.  ECG unremarkable.   All questions about cardiac cath answered.  Echo also ordered for today.  Further plans based on results.   Lance Muss

## 2022-08-20 NOTE — ED Notes (Signed)
Please contact patient's wife Kaleo Condrey with updates at [phone].

## 2022-08-21 ENCOUNTER — Encounter: Payer: Self-pay | Admitting: *Deleted

## 2022-08-21 ENCOUNTER — Encounter (HOSPITAL_COMMUNITY): Payer: Self-pay | Admitting: Cardiovascular Disease

## 2022-08-21 ENCOUNTER — Other Ambulatory Visit (HOSPITAL_COMMUNITY): Payer: Self-pay

## 2022-08-21 ENCOUNTER — Telehealth (HOSPITAL_COMMUNITY): Payer: Self-pay | Admitting: Pharmacy Technician

## 2022-08-21 DIAGNOSIS — Z006 Encounter for examination for normal comparison and control in clinical research program: Secondary | ICD-10-CM

## 2022-08-21 DIAGNOSIS — E782 Mixed hyperlipidemia: Secondary | ICD-10-CM

## 2022-08-21 DIAGNOSIS — I10 Essential (primary) hypertension: Secondary | ICD-10-CM

## 2022-08-21 LAB — LIPID PANEL
Cholesterol: 114 mg/dL (ref 0–200)
HDL: 33 mg/dL — ABNORMAL LOW (ref >40)
LDL Cholesterol: 55 mg/dL (ref 0–99)
Total CHOL/HDL Ratio: 3.5 RATIO
Triglycerides: 129 mg/dL (ref <150)
VLDL: 26 mg/dL (ref 0–40)

## 2022-08-21 LAB — CBC
HCT: 44.5 % (ref 39.0–52.0)
Hemoglobin: 15.2 g/dL (ref 13.0–17.0)
MCH: 32.6 pg (ref 26.0–34.0)
MCHC: 34.2 g/dL (ref 30.0–36.0)
MCV: 95.5 fL (ref 80.0–100.0)
Platelets: 185 10*3/uL (ref 150–400)
RBC: 4.66 MIL/uL (ref 4.22–5.81)
RDW: 12.2 % (ref 11.5–15.5)
WBC: 10.5 10*3/uL (ref 4.0–10.5)
nRBC: 0 % (ref 0.0–0.2)

## 2022-08-21 LAB — BASIC METABOLIC PANEL
Anion gap: 8 (ref 5–15)
BUN: 15 mg/dL (ref 8–23)
CO2: 22 mmol/L (ref 22–32)
Calcium: 8.6 mg/dL — ABNORMAL LOW (ref 8.9–10.3)
Chloride: 107 mmol/L (ref 98–111)
Creatinine, Ser: 0.82 mg/dL (ref 0.61–1.24)
GFR, Estimated: >60 mL/min (ref >60)
Glucose, Bld: 107 mg/dL — ABNORMAL HIGH (ref 70–99)
Potassium: 3.5 mmol/L (ref 3.5–5.1)
Sodium: 137 mmol/L (ref 135–145)

## 2022-08-21 LAB — LIPOPROTEIN A (LPA): Lipoprotein (a): 42.2 nmol/L — ABNORMAL HIGH (ref <75.0)

## 2022-08-21 MED ORDER — TICAGRELOR 90 MG PO TABS
90.0000 mg | ORAL_TABLET | Freq: Two times a day (BID) | ORAL | 11 refills | Status: DC
  Filled 2022-08-21: qty 60, 30d supply, fill #0

## 2022-08-21 MED ORDER — STUDY - EVOLVE-MI - EVOLOCUMAB (REPATHA) 140 MG/ML SQ INJECTION (PI-STUCKEY): 140.0000 mg | INJECTION | SUBCUTANEOUS | 0 refills | Status: DC

## 2022-08-21 MED ORDER — METOPROLOL TARTRATE 25 MG PO TABS
25.0000 mg | ORAL_TABLET | Freq: Two times a day (BID) | ORAL | 11 refills | Status: DC
  Filled 2022-08-21: qty 60, 30d supply, fill #0

## 2022-08-21 MED ORDER — ACETAMINOPHEN 325 MG PO TABS: 650.0000 mg | ORAL_TABLET | ORAL | Status: AC | PRN

## 2022-08-21 MED ORDER — NITROGLYCERIN 0.4 MG SL SUBL
0.4000 mg | SUBLINGUAL_TABLET | SUBLINGUAL | 0 refills | Status: DC | PRN
  Filled 2022-08-21: qty 25, 30d supply, fill #0

## 2022-08-21 MED ORDER — LOSARTAN POTASSIUM 25 MG PO TABS
12.5000 mg | ORAL_TABLET | Freq: Every day | ORAL | 11 refills | Status: DC
  Filled 2022-08-21: qty 30, 60d supply, fill #0

## 2022-08-21 MED ORDER — LOSARTAN POTASSIUM 25 MG PO TABS
12.5000 mg | ORAL_TABLET | Freq: Every day | ORAL | Status: DC
  Administered 2022-08-21: 12.5 mg via ORAL
  Filled 2022-08-21: qty 1

## 2022-08-21 MED ORDER — ROSUVASTATIN CALCIUM 20 MG PO TABS
20.0000 mg | ORAL_TABLET | Freq: Every day | ORAL | 3 refills | Status: DC
  Filled 2022-08-21: qty 90, 90d supply, fill #0

## 2022-08-21 MED ORDER — STUDY - EVOLVE-MI - EVOLOCUMAB (REPATHA) 140 MG/ML SQ INJECTION (PI-STUCKEY)
140.0000 mg | INJECTION | SUBCUTANEOUS | Status: DC
  Administered 2022-08-21: 140 mg via SUBCUTANEOUS
  Filled 2022-08-21: qty 1

## 2022-08-21 MED FILL — Heparin Sod (Porcine)-NaCl IV Soln 1000 Unit/500ML-0.9%: INTRAVENOUS | Qty: 500 | Status: AC

## 2022-08-21 NOTE — Care Management (Signed)
  Transition of Care [facility]) Screening Note   Patient Details  Name: Joshua Gomez Date of Birth: 1956-05-31   Transition of Care [facility]) CM/SW Contact:    Gala Lewandowsky, RN Phone Number: 08/21/2022, 10:27 AM    Transition of Care Department [facility], [place]) has reviewed the patient and no TOC needs have been identified at this time. We will continue to monitor patient advancement through interdisciplinary progression rounds. If new patient transition needs arise, please place a TOC consult.

## 2022-08-21 NOTE — Progress Notes (Signed)
CARDIAC REHAB PHASE I   PRE:  Rate/Rhythm: 61/NSR    BP: sitting 127/78   MODE:  Ambulation: 450 ft    POST:  Rate/Rhythm: 63/NSR    BP: sitting 135/85   Patient ambulated 450 ft with standby assist, tolerated well, denies CP, SOB, nausea, or dizziness. Patient educated on risk factors, exercise guidelines, angina, ntg, Brilinta, catheterization site care, restrictions, PTCA, stent, nutrition; cardiac rehab phase 2 discussed with patient.  [phone] Newell Coral RN 08/21/2022 10:16 AM

## 2022-08-21 NOTE — Discharge Summary (Addendum)
Discharge Summary    Patient ID: Joshua Gomez MRN: [id number]; DOB: November 14, 1956  Admit date: 08/19/2022 Discharge date: 08/21/2022  PCP:  Patient, No Pcp Per   [place] Prov[facility] Cardiologist:  Elouise Munroe, MD     Discharge Diagnoses    Principal Problem:   Non-ST elevation (NSTEMI) myocardial infarction [facility]) Active Problems:   HTN (hypertension)   HLD (hyperlipidemia)   ACS (acute coronary syndrome) [facility])  Diagnostic Studies/Procedures    Cath: 08/20/22    Prox LAD to Mid LAD lesion is 95% stenosed.   Dist Cx lesion is 70% stenosed.   1st Mrg lesion is 100% stenosed.   Prox RCA lesion is 70% stenosed.   Ost LM to Mid LM lesion is 30% stenosed.   A drug-eluting stent was successfully placed using a STENT ONYX FRONTIER 3.0X38.   A drug-eluting stent was successfully placed using a STENT ONYX FRONTIER 3.0X38.   Post intervention, there is a 0% residual stenosis.   Post intervention, there is a 0% residual stenosis.   1.  Patent left main with mild nonobstructive disease 2.  Severe proximal to mid LAD stenosis, treated with a 3.0 x 38 mm Onyx frontier DES, reducing 95% stenosis to 0% post procedure 3.  Total occlusion of the first OM branch of the circumflex, treated with PCI using a 3.0 x 38 mm Onyx frontier DES, reducing 100% stenosis to 0% post procedure 4.  Moderate residual disease in the distal AV circumflex (dominant vessel) and nondominant RCA, both lesions appropriate for medical therapy   Recommendations: Medical therapy for residual CAD.  Aggressive secondary risk reduction measures.  Dual antiplatelet therapy with aspirin and ticagrelor at least 12 months.  Diagnostic Dominance: Left  Intervention   Echo: 08/20/22  IMPRESSIONS     1. Left ventricular ejection fraction, by estimation, is 60 to 65%. The  left ventricle has normal function. The left ventricle has no regional  wall motion abnormalities. Left ventricular diastolic  parameters were  normal.   2. Right ventricular systolic function is normal. The right ventricular  size is normal.   3. The mitral valve is normal in structure. Mild mitral valve  regurgitation. No evidence of mitral stenosis.   4. The aortic valve is tricuspid. There is mild calcification of the  aortic valve. There is mild thickening of the aortic valve. Aortic valve  regurgitation is mild to moderate. Aortic valve sclerosis/calcification is  present, without any evidence of  aortic stenosis.   5. The inferior vena cava is normal in size with greater than 50%  respiratory variability, suggesting right atrial pressure of 3 mmHg.   FINDINGS   Left Ventricle: Left ventricular ejection fraction, by estimation, is 60  to 65%. The left ventricle has normal function. The left ventricle has no  regional wall motion abnormalities. Definity contrast agent was given IV  to delineate the left ventricular   endocardial borders. The left ventricular internal cavity size was normal  in size. There is no left ventricular hypertrophy. Left ventricular  diastolic parameters were normal.   Right Ventricle: The right ventricular size is normal. No increase in  right ventricular wall thickness. Right ventricular systolic function is  normal.   Left Atrium: Left atrial size was normal in size.   Right Atrium: Right atrial size was normal in size.   Pericardium: There is no evidence of pericardial effusion.   Mitral Valve: The mitral valve is normal in structure. Mild mitral valve  regurgitation,  with centrally-directed jet. No evidence of mitral valve  stenosis.   Tricuspid Valve: The tricuspid valve is normal in structure. Tricuspid  valve regurgitation is not demonstrated. No evidence of tricuspid  stenosis.   Aortic Valve: The aortic valve is tricuspid. There is mild calcification  of the aortic valve. There is mild thickening of the aortic valve. Aortic  valve regurgitation is mild to  moderate. Aortic valve  sclerosis/calcification is present, without any  evidence of aortic stenosis.   Pulmonic Valve: The pulmonic valve was grossly normal. Pulmonic valve  regurgitation is not visualized. No evidence of pulmonic stenosis.   Aorta: The aortic root is normal in size and structure.   Venous: The inferior vena cava is normal in size with greater than 50%  respiratory variability, suggesting right atrial pressure of 3 mmHg.   IAS/Shunts: No atrial level shunt detected by color flow Doppler.  _____________   History of Present Illness     Joshua Gomez is a 66 y.o. male with past medical history of hypertension and hyperlipidemia who presented with chest pain and found to have a non-STEMI.  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  It was substernal and nonradiating.  Blood pressure was noted to be significantly elevated and he had associated diaphoresis with no shortness of breath or nausea or vomiting.  Initial troponin 140.  He was admitted to cardiology with plans to undergo cardiac catheterization.  Hospital Course     NSTEMI -- High-sensitivity troponin peaked at 501.  Treated with IV heparin as well as nitroglycerin and pain-free.  Underwent cardiac catheterization noted above with patent left main with mild nonobstructive disease, severe proximal/mid LAD stenosis treated with PCI/DES x1 as well as total occlusion of first OM of the circumflex treated with PCI/DES x1, moderate residual disease in distal AV circumflex and nondominant RCA to be treated medically.  Recommendations for DAPT with aspirin/Brilinta for at least 1 year.  No recurrent chest pain post cath.  Follow-up echocardiogram showed LVEF of 60 to 65% with no regional wall motion abnormality, normal RV and mild MR.  -- aspirin, Brilinta, metoprolol 25 mg twice daily, losartan 12.5 mg daily, Crestor  20 mg daily   Hypertension -- Continue metoprolol 25 mg twice  daily, added losartan 12.5 mg daily at discharge   Hyperlipidemia --LDL 55, HDL 33 -- Crestor 20 mg daily -- Will need FLP/LFTs in 8 weeks  -- enrolled in Evolve study prior to discharge  General: Well developed, well nourished, male appearing in no acute distress. Head: Normocephalic, atraumatic.  Neck: Supple without bruits, JVD. Lungs:  Resp regular and unlabored, CTA. Heart: RRR, S1, S2, no S3, S4, or murmur; no rub. Abdomen: Soft, non-tender, non-distended with normoactive bowel sounds. No hepatomegaly. No rebound/guarding. No obvious abdominal masses. Extremities: No clubbing, cyanosis, edema. Distal pedal pulses are 2+ bilaterally. Right cath site stable without bruising or hematoma Neuro: Alert and oriented X 3. Moves all extremities spontaneously. Psych: Normal affect.  Did the patient have an acute coronary syndrome (MI, NSTEMI, STEMI, etc) this admission?:  Yes                               AHA/ACC Clinical Performance & Quality Measures: Aspirin prescribed? - Yes ADP Receptor Inhibitor (Plavix/Clopidogrel, Brilinta/Ticagrelor or Effient/Prasugrel) prescribed (includes medically managed patients)? - Yes Beta Blocker prescribed? - Yes High Intensity Statin (Lipitor 40-46m or  Crestor 20-59m) prescribed? - Yes EF assessed during THIS hospitalization? - Yes For EF <40%, was ACEI/ARB prescribed? - Not Applicable (EF >/= 430% For EF <40%, Aldosterone Antagonist (Spironolactone or Eplerenone) prescribed? - Not Applicable (EF >/= 486% Cardiac Rehab Phase II ordered (including medically managed patients)? - Yes   The patient will be scheduled for a TOC follow up appointment in 10-14 days.  A message has been sent to the [facility] at the office where the patient should be seen for follow up.  _____________  Discharge Vitals Blood pressure 126/89, pulse (!) 58, temperature 98.3 F (36.8 C), temperature source Oral, resp. rate 18, height _0  (1.803 m), weight  122.2 kg, SpO2 98 %.  Filed Weights   08/19/22 1803 08/20/22 2023  Weight: 122.5 kg 122.2 kg    Labs & Radiologic Studies    CBC Recent Labs    08/20/22 1315 08/21/22 0434  WBC 11.7* 10.5  HGB 14.9 15.2  HCT 44.9 44.5  MCV 96.1 95.5  PLT 212 1578  Basic Metabolic Panel Recent Labs    08/19/22 1446 08/19/22 2243 08/21/22 0434  NA 142  --  137  K 3.7  --  3.5  CL 107  --  107  CO2 23  --  22  GLUCOSE 126*  --  107*  BUN 20  --  15  CREATININE 1.00  --  0.82  CALCIUM 9.6  --  8.6*  MG  --  1.9  --    Liver Function Tests Recent Labs    08/19/22 2243  AST 43*  ALT 21  ALKPHOS 29*  BILITOT 0.8  PROT 6.0*  ALBUMIN 3.7   No results for input(s): "LIPASE", "AMYLASE" in the last 72 hours. High Sensitivity Troponin:   Recent Labs  Lab 08/19/22 1446 08/19/22 1723  TROPONINIHS 140* 501*    BNP Invalid input(s): "POCBNP" D-Dimer No results for input(s): "DDIMER" in the last 72 hours. Hemoglobin A1C Recent Labs    08/19/22 2243  HGBA1C 5.2   Fasting Lipid Panel Recent Labs    08/21/22 0434  CHOL 114  HDL 33*  LDLCALC 55  TRIG 129  CHOLHDL 3.5   Thyroid Function Tests Recent Labs    08/19/22 2243  TSH 0.772   _____________  CARDIAC CATHETERIZATION  Result Date: 08/20/2022   Prox LAD to Mid LAD lesion is 95% stenosed.   Dist Cx lesion is 70% stenosed.   1st Mrg lesion is 100% stenosed.   Prox RCA lesion is 70% stenosed.   Ost LM to Mid LM lesion is 30% stenosed.   A drug-eluting stent was successfully placed using a STENT ONYX FRONTIER 3.0X38.   A drug-eluting stent was successfully placed using a STENT ONYX FRONTIER 3.0X38.   Post intervention, there is a 0% residual stenosis.   Post intervention, there is a 0% residual stenosis. 1.  Patent left main with mild nonobstructive disease 2.  Severe proximal to mid LAD stenosis, treated with a 3.0 x 38 mm Onyx frontier DES, reducing 95% stenosis to 0% post procedure 3.  Total occlusion of the first OM  branch of the circumflex, treated with PCI using a 3.0 x 38 mm Onyx frontier DES, reducing 100% stenosis to 0% post procedure 4.  Moderate residual disease in the distal AV circumflex (dominant vessel) and nondominant RCA, both lesions appropriate for medical therapy Recommendations: Medical therapy for residual CAD.  Aggressive secondary risk reduction measures.  Dual antiplatelet therapy  with aspirin and ticagrelor at least 12 months.   ECHOCARDIOGRAM COMPLETE  Result Date: 08/20/2022    ECHOCARDIOGRAM REPORT   Patient Name:   IDEN STRIPLING Date of Exam: 08/20/2022 Medical Rec #:  [id number]        Height:       71.0 in Accession #:    [id number]       Weight:       270.0 lb Date of Birth:  24-Mar-1956        BSA:          2.395 m Patient Age:    66 years         BP:           140/115 mmHg Patient Gender: M                HR:           63 bpm. Exam Location:  Inpatient Procedure: 2D Echo, Intracardiac Opacification Agent, Cardiac Doppler and Color            Doppler Indications:    Chest pain  History:        Patient has no prior history of Echocardiogram examinations.                 Risk Factors:Dyslipidemia, Hypertension and Family History of                 Coronary Artery Disease.  Sonographer:    Eartha Inch Referring Phys: Isaiah Serge  Sonographer Comments: Image acquisition challenging due to patient body habitus. IMPRESSIONS  1. Left ventricular ejection fraction, by estimation, is 60 to 65%. The left ventricle has normal function. The left ventricle has no regional wall motion abnormalities. Left ventricular diastolic parameters were normal.  2. Right ventricular systolic function is normal. The right ventricular size is normal.  3. The mitral valve is normal in structure. Mild mitral valve regurgitation. No evidence of mitral stenosis.  4. The aortic valve is tricuspid. There is mild calcification of the aortic valve. There is mild thickening of the aortic valve. Aortic valve regurgitation is  mild to moderate. Aortic valve sclerosis/calcification is present, without any evidence of aortic stenosis.  5. The inferior vena cava is normal in size with greater than 50% respiratory variability, suggesting right atrial pressure of 3 mmHg. FINDINGS  Left Ventricle: Left ventricular ejection fraction, by estimation, is 60 to 65%. The left ventricle has normal function. The left ventricle has no regional wall motion abnormalities. Definity contrast agent was given IV to delineate the left ventricular  endocardial borders. The left ventricular internal cavity size was normal in size. There is no left ventricular hypertrophy. Left ventricular diastolic parameters were normal. Right Ventricle: The right ventricular size is normal. No increase in right ventricular wall thickness. Right ventricular systolic function is normal. Left Atrium: Left atrial size was normal in size. Right Atrium: Right atrial size was normal in size. Pericardium: There is no evidence of pericardial effusion. Mitral Valve: The mitral valve is normal in structure. Mild mitral valve regurgitation, with centrally-directed jet. No evidence of mitral valve stenosis. Tricuspid Valve: The tricuspid valve is normal in structure. Tricuspid valve regurgitation is not demonstrated. No evidence of tricuspid stenosis. Aortic Valve: The aortic valve is tricuspid. There is mild calcification of the aortic valve. There is mild thickening of the aortic valve. Aortic valve regurgitation is mild to moderate. Aortic valve sclerosis/calcification is present, without any evidence of aortic stenosis. Pulmonic  Valve: The pulmonic valve was grossly normal. Pulmonic valve regurgitation is not visualized. No evidence of pulmonic stenosis. Aorta: The aortic root is normal in size and structure. Venous: The inferior vena cava is normal in size with greater than 50% respiratory variability, suggesting right atrial pressure of 3 mmHg. IAS/Shunts: No atrial level shunt  detected by color flow Doppler.  LEFT VENTRICLE PLAX 2D LVIDd:         5.60 cm   Diastology LVIDs:         3.70 cm   LV e' medial:    7.94 cm/s LV PW:         0.90 cm   LV E/e' medial:  8.6 LV IVS:        0.90 cm   LV e' lateral:   10.60 cm/s LVOT diam:     2.25 cm   LV E/e' lateral: 6.4 LV SV:         78 LV SV Index:   33 LVOT Area:     3.98 cm  RIGHT VENTRICLE             IVC RV S prime:     12.20 cm/s  IVC diam: 1.90 cm TAPSE (M-mode): 1.6 cm LEFT ATRIUM           Index        RIGHT ATRIUM           Index LA diam:      4.30 cm 1.80 cm/m   RA Area:     10.30 cm LA Vol (A2C): 70.4 ml 29.39 ml/m  RA Volume:   16.60 ml  6.93 ml/m LA Vol (A4C): 74.3 ml 31.02 ml/m  AORTIC VALVE LVOT Vmax:   95.10 cm/s LVOT Vmean:  69.600 cm/s LVOT VTI:    0.197 m  AORTA Ao Root diam: 3.60 cm Ao Asc diam:  3.10 cm MITRAL VALVE MV Area (PHT): 3.37 cm    SHUNTS MV Decel Time: 225 msec    Systemic VTI:  0.20 m MR Peak grad: 34.6 mmHg    Systemic Diam: 2.25 cm MR Vmax:      294.00 cm/s MV E velocity: 68.00 cm/s MV A velocity: 52.80 cm/s MV E/A ratio:  1.29 Mihai Croitoru MD Electronically signed by Sanda Klein MD Signature Date/Time: 08/20/2022/9:45:28 AM    Final    DG Chest 2 View  Result Date: 08/19/2022 CLINICAL DATA:  Shortness of breath EXAM: CHEST - 2 VIEW COMPARISON:  Radiograph 01/29/2008 FINDINGS: Borderline enlarged cardiac silhouette. Low lung volumes. Mild interstitial opacities. No pleural effusion. No pneumothorax. No acute osseous abnormality. Thoracic spondylosis. IMPRESSION: Borderline enlarged cardiac silhouette. Low lung volumes with prominent interstitium due to vascular crowding or interstitial edema. Electronically Signed   By: Maurine Simmering M.D.   On: 08/19/2022 15:07    Disposition   Pt is being discharged home today in good condition.  Follow-up Plans & Appointments     Follow-up Information     Darreld Mclean, PA-C Follow up on 08/28/2022.   Specialties: Physician Assistant,  Cardiology Why: at 10:55am for your follow up appt with Dr. Hamilton Capri' PA [facility] Contact information: [address] [place] [address] [phone]                Discharge Instructions     Amb Referral to Cardiac Rehabilitation   Complete by: As directed    Diagnosis: Coronary Stents   After initial evaluation and assessments completed: Virtual Based Care  may be provided alone or in conjunction with Phase 2 Cardiac Rehab based on patient barriers.: Yes   Intensive Cardiac Rehabilitation (ICR) [place] location only OR Traditional Cardiac Rehabilitation (TCR) If criteria for ICR are not met will enroll in TCR [facility] only): Yes   Call MD for:  difficulty breathing, headache or visual disturbances   Complete by: As directed    Call MD for:  persistant dizziness or light-headedness   Complete by: As directed    Call MD for:  redness, tenderness, or signs of infection (pain, swelling, redness, odor or green/yellow discharge around incision site)   Complete by: As directed    Diet - low sodium heart healthy   Complete by: As directed    Discharge instructions   Complete by: As directed    Radial Site Care Refer to this sheet in the next few weeks. These instructions provide you with information on caring for yourself after your procedure. Your caregiver may also give you more specific instructions. Your treatment has been planned according to current medical practices, but problems sometimes occur. Call your caregiver if you have any problems or questions after your procedure. HOME CARE INSTRUCTIONS You may shower the day after the procedure. Remove the bandage (dressing) and gently wash the site with plain soap and water. Gently pat the site dry.  Do not apply powder or lotion to the site.  Do not submerge the affected site in water for 3 to 5 days.  Inspect the site at least twice daily.  Do not flex or bend the affected arm for 24 hours.  No lifting over 5 pounds (2.3  kg) for 5 days after your procedure.  Do not drive home if you are discharged the same day of the procedure. Have someone else drive you.  You may drive 24 hours after the procedure unless otherwise instructed by your caregiver.  What to expect: Any bruising will usually fade within 1 to 2 weeks.  Blood that collects in the tissue (hematoma) may be painful to the touch. It should usually decrease in size and tenderness within 1 to 2 weeks.  SEEK IMMEDIATE MEDICAL CARE IF: You have unusual pain at the radial site.  You have redness, warmth, swelling, or pain at the radial site.  You have drainage (other than a small amount of blood on the dressing).  You have chills.  You have a fever or persistent symptoms for more than 72 hours.  You have a fever and your symptoms suddenly get worse.  Your arm becomes pale, cool, tingly, or numb.  You have heavy bleeding from the site. Hold pressure on the site.   PLEASE DO NOT MISS ANY DOSES OF YOUR BRILINTA!!!!! Also keep a log of you blood pressures and bring back to your follow up appt. Please call the office with any questions.   Patients taking blood thinners should generally stay away from medicines like ibuprofen, Advil, Motrin, naproxen, and Aleve due to risk of stomach bleeding. You may take Tylenol as directed or talk to your primary doctor about alternatives.   PLEASE ENSURE THAT YOU DO NOT RUN OUT OF YOUR BRILINTA. This medication is very important to remain on for at least one year. IF you have issues obtaining this medication due to cost please CALL the office 3-5 business days prior to running out in order to prevent missing doses of this medication.   Increase activity slowly   Complete by: As directed  Discharge Medications   Allergies as of 08/21/2022   No Known Allergies      Medication List     STOP taking these medications    celecoxib 200 MG capsule Commonly known as: CELEBREX       TAKE these medications     acetaminophen 325 MG tablet Commonly known as: TYLENOL Take 2 tablets (650 mg total) by mouth every 4 (four) hours as needed for mild pain or moderate pain.   ascorbic acid 500 MG tablet Commonly known as: VITAMIN C Take 500 mg by mouth daily. Notes to patient: Due 08/22/22 8am   aspirin EC 81 MG tablet Take 81 mg by mouth daily.   Brilinta 90 MG Tabs tablet Generic drug: ticagrelor Take 1 tablet (90 mg total) by mouth 2 (two) times daily.   buPROPion 150 MG 12 hr tablet Commonly known as: WELLBUTRIN SR Take 150 mg by mouth daily.   DULoxetine 20 MG capsule Commonly known as: CYMBALTA Take 20 mg by mouth daily.   EVOLVE-MI evolocumab 140 mg/1 mL SQ injection Inject 1 mL (140 mg total) into the skin every 14 (fourteen) days. For Investigational Use Only. Inject subcutaneously into abdomen, thigh, or upper arm every 14 days. Rotate injection sites and do not inject into areas where skin is tender, bruised, or red. Please contact [place] for any questions or concerns regarding this medication.   fluticasone 50 MCG/ACT nasal spray Commonly known as: FLONASE Place 2 sprays into both nostrils daily as needed for allergies.   losartan 25 MG tablet Commonly known as: COZAAR Take 1/2 tablet (12.5 mg total) by mouth daily.   metoprolol tartrate 25 MG tablet Commonly known as: LOPRESSOR Take 1 tablet (25 mg total) by mouth 2 (two) times daily.   MULTIVITAMIN ADULT PO Take 1 tablet by mouth daily.   nitroGLYCERIN 0.4 MG SL tablet Commonly known as: NITROSTAT Place 1 tablet (0.4 mg total) under the tongue every 5 (five) minutes x 3 doses as needed for chest pain.   rosuvastatin 20 MG tablet Commonly known as: CRESTOR Take 1 tablet (20 mg total) by mouth daily. What changed:  medication strength how much to take           Outstanding Labs/Studies   FLP/LFTs in 8 weeks BMET at follow up visit  Duration of Discharge Encounter   Greater than 30  minutes including physician time.  Signed, Reino Bellis, NP 08/21/2022, 11:06 AM   I have examined the patient and reviewed assessment and plan and discussed with patient.  Agree with above as stated.    GEN: Well nourished, well developed, in no acute distress  HEENT: normal  Neck: no JVD, carotid bruits, or masses Cardiac: RRR; no murmurs, rubs, or gallops,no edema  Respiratory:  clear to auscultation bilaterally, normal work of breathing GI: soft, nontender, nondistended,  MS: no deformity or atrophy ; 2+ right radial pulse.  No hematoma. Skin: warm and dry, no rash Neuro:  Strength and sensation are intact Psych: euthymic mood, full affect  Successful PCI of the OM and mid LAD.  OM was likely culprit vessel for his MI.  He has signed up for a lipid lowering study and is receiving a PCSK9 inhibitor.  Hopefully, this will be helpful in the setting of his other moderate coronary artery disease.  Plan for discharge later today.  We spoke about the importance of a healthy, whole food, plant-based diet and regular exercise.  Cardiac rehab was recommended as well.  Larae Grooms

## 2022-08-21 NOTE — Research (Unsigned)
EVOLVE Informed Consent   Subject Name: Joshua Gomez  Subject met inclusion and exclusion criteria.  The informed consent form, study requirements and expectations were reviewed with the subject and questions and concerns were addressed prior to the signing of the consent form.  The subject verbalized understanding of the trial requirements.  The subject agreed to participate in the EVOLVE trial and signed the informed consent at 09:16 on 08-21-2022.  The informed consent was obtained prior to performance of any protocol-specific procedures for the subject.  A copy of the signed informed consent was given to the subject and a copy was placed in the subject's medical record.   [place] Kyarra Vancamp

## 2022-08-21 NOTE — Research (Addendum)
Protocol # [id number]   Subject ID#                               DAY 1 Date:        21-Aug-2022  Randomization:   Geographic Region:    [place]  Randomization Date:     21-Aug-2022  Randomization Time:       Treatment type Assigned   [x]  Treatment                                   []   Control          Protocol # [id number]   Subject ID#                               DAY 1 Date:        21-Aug-2022     Initial Study Treatment- Evolocumab Self-administration  Training for Evolocumab Self-Administration Completed?  [x]  YES  []   NO  Training for Evolocumab Self-Administration Date:   _____  21-Aug-2022  Evolocumab Administered?  [x]  YES  []   NO  Evolocumab Start Date: ________  21-Aug-2022  Lot Number of Initial Dose Administered: _[id number]  Additional Lot Number Distributed to Subject: [id number]  Additional Lot Number Distributed to Subject: [id number]  Additional Lot Number Distributed to Subject: -[id number]  Additional Lot Number Distributed to Subject: -[id number]  Additional Lot Number Distributed to Subject: -  [id number]   Protocol # [id number]  Subject Initial ID#:   [id number]                          Age in years: 65  *Demographics are found in the [place] EMR source.   Protocol Version:   Were all Eligibility Criteria Met?  [x]  Yes  []   No  Screening Visit Date:               21-Aug-2022  10.2 Targeted Medical History:  Qualifying Acute Myocardial Infarction Event  [x]  NSTEMI  []  STEMI  Admission Date For Index Myocardial Infarction:    20-Aug-2023  Initial Management Strategy: []  Medical Management ONLY     [x]  Invasive Mgmt (including Lysis)  Diagnostic Coronary Angiography Performed for Index MI Event? [x]  Yes []  No   Diagnostic Coronary Angiography Performed For Index Myocardial Infarction Event (Select All That Apply)    []  Invasive Coronary Angiography         [x]  Computed Tomography (CT) angiography   Coronary Revascularization Performed For  Index Myocardial Infarction Event?    [x]  Yes  []  No  Coronary Revascularization Performed For Index Myocardial Infarction Event (Select All That Apply)  [x] Percutaneous Coronary Intervention (PCI)                                        []  Coronary Artery Bypass Graft (CABG)                  []  Fibrinolysis  Ejection Fraction Measured?  []  Yes  [x]   No  Ejection Fraction (%) ____  (must be whole number)  Peak Troponin Level For Index Myocardial Infarction Event?  _501_____   Peak Troponin Unit For Index Myocardial Infarction  Event?  ng/L Troponin Normal Range Upper Limit    _18__  History of Multi-vessel Coronary Artery Disease (Including defined during index myocardial infarction event)   []  Yes  [x]   No  Cardiovascular and Other Medical History History of Acute Myocardial Infarction Prior To Index Event   []  Yes [x]  No  History of Coronary Artery Bypass Graft (CABG) []  Yes  [x]   No  History of Percutaneous Coronary Intervention (PCI)  []  Yes  [x]   No  History Of Hypertension  [x]  Yes  []   No  History of Diabetes Mellitus  []  Yes  [x]   No  History of Peripheral Artery Disease (Lower Extremity, Carotid Disease)                            []  Yes  [x]   No  History of Cerebrovascular Disease   (Prior Stroke, Transient Ischemic Attack (TIA))  []  Yes  [x]   No  History of Lower Extremity Revascularization  []  Yes  [x]   No  History of Major Lower Extremity Amputation At Or Above The Level Of The Ankle                        []  Yes  []   No  History of Smoking Tobacco  [x]  Never Smoked     []  Former Smoker     []  Current Smoker     []  Unknown  VITAL SIGNS (after Consent is signed) Height   _71______       []  centimeters  [x]   Inches  Weight 270  []  Kilograms  [x]  Lbs   Systolic Blood Pressure (mmHg)    126  (Most recent Prior to Randomization)  Diastolic Blood Pressure (mmHg)    89  (Most recent Prior to Randomization)       Protocol # [id number]   Subject Initial ID#         10.4 Laboratory Assessments - Baseline Baseline Laboratory Assessments Performed? [x]  Yes []   No  Lipid Panel Lipid Panel available?  [x]  Yes  []   No Lipid Panel Date?      21-Aug-2022   Total Cholesterol Result?  114___   Unit: [x]   mg/dL   []  mmol/L     High Density Lipoprotein Cholesterol (HDL-C) Result? _33____         Unit: [x]   mg/dL   []  mmol/L     Triglycerides?  129______   Unit: [x]   mg/dL   []  mmol/L               Low Density Lipoprotein Cholesterol (LDL-C) 55          Unit: [x]   mg/dL   []  mmol/L           Apolipoprotein A1 (Apo A1)    []   Yes  [x]   No   Result if yes:_____   Apolipoprotein B (ApoB)   []   Yes  [x]   No    Result if yes:______   Lipoprotein(a) Available?   [x]   Yes  []   No      Date:     DD-MMM-YYYY      Result:  42.2____      Unit:  nmol/L  Other Labs:   Creatinine Available?   [x]   Yes  []   No      Date:    21-Aug-2022      Result:  _0.82___  Unit:  mg/dL   Estimated Glomerular Filtration Rate (eGFR) Available?   [x]   Yes  []   No      Date:     21-Aug-2022      Result:  60____      Unit:  mL/min/1.97m   Alanine Aminotransferase (ALT) / Serum Glutamic-Pyruvic Transaminase (SGPT)     Available?   []   Yes  []   No       Date:     DD-MMM-YYYY      Result:  ____      Unit:  U/L      Normal Range: Lower Limit;    0      Normal Range: Upper Limit:   44   Aspartate Aminotransferase (AST) / Serum Glutamic-Oxaloacetic Transaminase (SGOT)     Available?   []   Yes  []   No       Date:     DD-MMM-YYYY      Result:  ____      Unit:  U/L      Normal Range: Lower Limit;   15      Normal Range: Upper Limit:   41   Total Bilirubin   Available?   []   Yes  []   No       Date:     DD-MMM-YYYY      Result:  ____      Unit:  mg/dL       Normal Range: Lower Limit;   0.3      Normal Range: Upper Limit: 1.2      Glucose Available?   [x]   Yes  []   No       Date: 21-Aug-2022     Result:  _107___      Unit:  mg/dL     Normal Range: Lower Limit;  70      Normal Range: Upper Limit: 99  Hemoglobin A1c (HbA1c) Available?   [x]   Yes  []   No       Date:     19-Aug-2022      Result:  5.2____  (%)       Hemoglobin Available?   [x]   Yes  []   No    Date:  20-Aug-2022       Hgb Level 14.9      Unit:  g/dL  White Blood Cell Count Available?   [x]   Yes  []   No   Date:  20-Aug-2022      WBC count:  11__      Unit:  1000/mmm3  Platelet Available?   [x]   Yes  []   No       Date:     20-Aug-2022       Platelet count:  _212___      Unit:  1000/mmm3      High-Sensitivity C-Reactive Protein (hsCRP).?[]   Yes  [x]   No      Result if Yes,  ____  Very Low Density Lipoprotein Cholesterol (VLDL-C)? [x]  Yes  []  No     Result if Yes,26 ____   No current facility-administered medications for this visit.  Current Outpatient Medications:    acetaminophen (TYLENOL) 325 MG tablet, Take 2 tablets (650 mg total) by mouth every 4 (four) hours as needed for mild pain or moderate pain., Disp: , Rfl:    losartan (COZAAR) 25 MG tablet, Take 1/2 tablet (12.5 mg total) by mouth daily., Disp: 30 tablet, Rfl: 11   metoprolol tartrate (LOPRESSOR) 25 MG tablet,  Take 1 tablet (25 mg total) by mouth 2 (two) times daily., Disp: 60 tablet, Rfl: 11   nitroGLYCERIN (NITROSTAT) 0.4 MG SL tablet, Place 1 tablet (0.4 mg total) under the tongue every 5 (five) minutes x 3 doses as needed for chest pain., Disp: 25 tablet, Rfl: 0   rosuvastatin (CRESTOR) 20 MG tablet, Take 1 tablet (20 mg total) by mouth daily., Disp: 90 tablet, Rfl: 3   Study - EVOLVE-MI - evolocumab (REPATHA) 140 mg/mL SQ injection (PI-Stuckey), Inject 1 mL (140 mg total) into the skin every 14 (fourteen) days. For Investigational Use Only. Inject subcutaneously into abdomen, thigh, or upper arm every 14 days. Rotate injection sites and do not inject into areas where skin is tender, bruised, or red. Please contact [place] for any questions or concerns regarding this medication., Disp: 12 mL, Rfl: 0    ticagrelor (BRILINTA) 90 MG TABS tablet, Take 1 tablet (90 mg total) by mouth 2 (two) times daily., Disp: 60 tablet, Rfl: 11  Facility-Administered Medications Ordered in Other Visits:    0.9 %  sodium chloride infusion, , Intravenous, Continuous, Sherren Mocha, MD, Last Rate: 10 mL/hr at 08/20/22 1506, Infusion Verify at 08/20/22 1506   0.9 %  sodium chloride infusion, 250 mL, Intravenous, PRN, Sherren Mocha, MD, Stopped at 08/20/22 2136   acetaminophen (TYLENOL) tablet 650 mg, 650 mg, Oral, Q4H PRN, Sherren Mocha, MD, 650 mg at 08/20/22 1016   ALPRAZolam (XANAX) tablet 0.25 mg, 0.25 mg, Oral, BID PRN, Sherren Mocha, MD   aspirin EC tablet 81 mg, 81 mg, Oral, Daily, Sherren Mocha, MD, 81 mg at 08/21/22 0843   DULoxetine (CYMBALTA) DR capsule 20 mg, 20 mg, Oral, Daily, Sherren Mocha, MD, 20 mg at 08/21/22 0844   losartan (COZAAR) tablet 12.5 mg, 12.5 mg, Oral, Daily, Reino Bellis B, NP, 12.5 mg at 08/21/22 0842   metoprolol tartrate (LOPRESSOR) tablet 25 mg, 25 mg, Oral, BID, Sherren Mocha, MD, 25 mg at 08/21/22 0842   nitroGLYCERIN (NITROSTAT) SL tablet 0.4 mg, 0.4 mg, Sublingual, Q5 min PRN, Sherren Mocha, MD, 0.4 mg at 08/19/22 1734   nitroGLYCERIN (NITROSTAT) SL tablet 0.4 mg, 0.4 mg, Sublingual, Q5 Min x 3 PRN, Sherren Mocha, MD   ondansetron [facility]) injection 4 mg, 4 mg, Intravenous, Q6H PRN, Sherren Mocha, MD   rosuvastatin (CRESTOR) tablet 20 mg, 20 mg, Oral, Daily, Sherren Mocha, MD, 20 mg at 08/21/22 0843   sodium chloride flush (NS) 0.9 % injection 3 mL, 3 mL, Intravenous, Q12H, Sherren Mocha, MD, 3 mL at 08/21/22 0846   sodium chloride flush (NS) 0.9 % injection 3 mL, 3 mL, Intravenous, Q12H, Sherren Mocha, MD, 3 mL at 08/21/22 0844   sodium chloride flush (NS) 0.9 % injection 3 mL, 3 mL, Intravenous, PRN, Sherren Mocha, MD   Study - EVOLVE-MI - evolocumab (REPATHA) 140 mg/mL SQ injection (PI-Stuckey), 140 mg, Subcutaneous, Q14 Days, Hillary Bow,  MD, 140 mg at 08/21/22 1044   ticagrelor (BRILINTA) tablet 90 mg, 90 mg, Oral, BID, Sherren Mocha, MD, 90 mg at 08/21/22 1145

## 2022-08-21 NOTE — Telephone Encounter (Signed)
Pharmacy Patient Advocate Encounter  Insurance verification completed.    The patient is insured through [employer] Part D   The patient is currently admitted and ran test claims for the following: Brilinta.  Copays and coinsurance results were relayed to Inpatient clinical team.  

## 2022-08-21 NOTE — TOC Benefit Eligibility Note (Signed)
Patient Product/process development scientist completed.    The patient is currently admitted and upon discharge could be taking Brilitna 90 mg.  The current 30 day co-pay is $37.00.   The patient is insured through [facility] Medicare Part D     Roland Earl, CPhT Pharmacy Patient Advocate Specialist [facility] Patient Advocate Team Direct Number: [phone]  Fax: [phone]

## 2022-08-23 NOTE — Progress Notes (Deleted)
Cardiology Office Note:    Date:  08/23/2022   ID:  Joshua Gomez, DOB [birth year], MRN [id number]  PCP:  Patient, No Pcp Per  Cardiologist:  Parke Poisson, MD  Electrophysiologist:  None   Referring MD: No ref. provider found   Chief Complaint: hospital follow-up of NSTEMI  History of Present Illness:    Joshua Gomez is a 66 y.o. male with a history of CAD with recent NSTEMI s/p DES of LAD and OM1, mild mitral regurgitation, hypertension, hyperlipidemia, and BPH who is followed by Dr. Jacques Navy and presents today for hospital follow-up of NSTEMI.  Patient was first seen by Cardiology during recent hospitalization. He was admitted from 08/19/2022 to 08/21/2022 for an NSTEMI after presenting with sudden onset of chest pain with associated diaphoresis. High-sensitivity peaked at 501. Echo showed LVEF of 60-65% with normal wall motion, normal diastolic parameters, mild MR, and aortic valve sclerosis without evidence of AS. Cardiac catheterization on 08/20/2022 which showed 30% stenosis of ostial to mid left main, 95% stenosis of the proximal to mid LAD, 100% stenosis of OM1, and 70% stenosis of distal LCA. He underwent successful PCI with DES to LAD and OM1. Moderate disease in LCX and RCA were treated medically. He was started on DAPT with Aspirin and Brilinta as well as Lopressor and Losartan. His home Crestor was increased to 20mg  daily and he was enrolled in the Evolve Trial.  Patient presents today for follow-up. ***  CAD with Recent NSTEMI Recently admitted with NSTEMI and underwent DES to LAD and OM1. Also found to have moderate disease in LCX and distal RCA which was treated medically. - No recurrent chest pain. *** - Continue DAPT with Aspirin and Brlinta. - Continue Lopressor 25mg  twice daily and Losartan 12.5mg  daily. - Continue Crestor 20mg  daily.  Mild Mitral Regurgitation Noted on recent Echo on 08/20/2022. - Can continue routine monitoring. Would consider repeat Echo in 3-5  years (or sooner if he develops any concerning symptoms).   Hypertension BP well controlled. *** - Continue Losartan 12.5mg  daily and Lopressor 25mg  twice daily. - Will repeat BMET today. ***  Hyperlipidemia Lipid panel on 08/21/2022 during recent admission: Total Cholesterol 114, Triglycerides 129, HDL 33, LDL 55. LDL goal <70 given CAD. - Crestor increased to 20mg  daily during recent admission. Continue. - Will repeat lipid panel and LFTs in 6-8 weeks. *** - Also enrolled in the Evolve Trial.   Past Medical History:  Diagnosis Date   Arthritis    BPH (benign prostatic hypertrophy)    Feeling of incomplete bladder emptying    History of kidney stones    History of MRSA infection    2005--  left lower leg abscess w/ MRSA   Idiopathic peripheral neuropathy    Lower urinary tract symptoms (LUTS)    Wears glasses    Wears hearing aid    BILATERAL    Past Surgical History:  Procedure Laterality Date   CORONARY STENT INTERVENTION N/A 08/20/2022   Procedure: CORONARY STENT INTERVENTION;  Surgeon: , MD;  Location: [facility] INVASIVE CV LAB;  Service: Cardiovascular;  Laterality: N/A;   CYSTO/  BILATERAL RETROGRADE PYELOGRAM  12-02-2005   LEFT HEART CATH AND CORONARY ANGIOGRAPHY N/A 08/20/2022   Procedure: LEFT HEART CATH AND CORONARY ANGIOGRAPHY;  Surgeon: 10/20/2022, MD;  Location: [facility] INVASIVE CV LAB;  Service: Cardiovascular;  Laterality: N/A;   TRANSURETHRAL RESECTION OF PROSTATE N/A 07/24/2015   Procedure: TRANSURETHRAL RESECTION OF THE PROSTATE (TURP) WITH GYRUS;  Surgeon: Ihor Gully, MD;  Location: [facility];  Service: Urology;  Laterality: N/A;    Current Medications: No outpatient medications have been marked as taking for the 08/28/22 encounter (Appointment) with Corrin Parker, PA-C.     Allergies:   Patient has no known allergies.   Social History   Socioeconomic History   Marital status: Married    Spouse name: Not on file   Number of  children: Not on file   Years of education: Not on file   Highest education level: Not on file  Occupational History   Not on file  Tobacco Use   Smoking status: Never   Smokeless tobacco: Never  Substance and Sexual Activity   Alcohol use: No   Drug use: No   Sexual activity: Not on file  Other Topics Concern   Not on file  Social History Narrative   Not on file   Social Determinants of Health   Financial Resource Strain: Not on file  Food Insecurity: Not on file  Transportation Needs: Not on file  Physical Activity: Not on file  Stress: Not on file  Social Connections: Not on file     Family History: The patient's family history is not on file.  ROS:   Please see the history of present illness.     EKGs/Labs/Other Studies Reviewed:    The following studies were reviewed:  Echocardiogram 08/20/2022: Impressions:  1. Left ventricular ejection fraction, by estimation, is 60 to 65%. The  left ventricle has normal function. The left ventricle has no regional  wall motion abnormalities. Left ventricular diastolic parameters were  normal.   2. Right ventricular systolic function is normal. The right ventricular  size is normal.   3. The mitral valve is normal in structure. Mild mitral valve  regurgitation. No evidence of mitral stenosis.   4. The aortic valve is tricuspid. There is mild calcification of the  aortic valve. There is mild thickening of the aortic valve. Aortic valve  regurgitation is mild to moderate. Aortic valve sclerosis/calcification is  present, without any evidence of  aortic stenosis.   5. The inferior vena cava is normal in size with greater than 50%  respiratory variability, suggesting right atrial pressure of 3 mmHg. _______________  Left Cardiac Catheterization 08/20/2022:   Prox LAD to Mid LAD lesion is 95% stenosed.   Dist Cx lesion is 70% stenosed.   1st Mrg lesion is 100% stenosed.   Prox RCA lesion is 70% stenosed.   Ost LM to Mid LM  lesion is 30% stenosed.   A drug-eluting stent was successfully placed using a STENT ONYX FRONTIER 3.0X38.   A drug-eluting stent was successfully placed using a STENT ONYX FRONTIER 3.0X38.   Post intervention, there is a 0% residual stenosis.   Post intervention, there is a 0% residual stenosis.   1.  Patent left main with mild nonobstructive disease 2.  Severe proximal to mid LAD stenosis, treated with a 3.0 x 38 mm Onyx frontier DES, reducing 95% stenosis to 0% post procedure 3.  Total occlusion of the first OM branch of the circumflex, treated with PCI using a 3.0 x 38 mm Onyx frontier DES, reducing 100% stenosis to 0% post procedure 4.  Moderate residual disease in the distal AV circumflex (dominant vessel) and nondominant RCA, both lesions appropriate for medical therapy   Recommendations: Medical therapy for residual CAD.  Aggressive secondary risk reduction measures.  Dual antiplatelet therapy with aspirin and ticagrelor at  least 12 months.  Diagnostic Dominance: Left  Intervention      EKG:  EKG ordered today. EKG personally reviewed and demonstrates ***.  Recent Labs: 08/19/2022: ALT 21; Magnesium 1.9; TSH 0.772 08/21/2022: BUN 15; Creatinine, Ser 0.82; Hemoglobin 15.2; Platelets 185; Potassium 3.5; Sodium 137  Recent Lipid Panel    Component Value Date/Time   CHOL 114 08/21/2022 0434   TRIG 129 08/21/2022 0434   HDL 33 (L) 08/21/2022 0434   CHOLHDL 3.5 08/21/2022 0434   VLDL 26 08/21/2022 0434   LDLCALC 55 08/21/2022 0434    Physical Exam:    Vital Signs: There were no vitals taken for this visit.    Wt Readings from Last 3 Encounters:  08/20/22 269 lb 4.8 oz (122.2 kg)  07/24/15 290 lb (131.5 kg)     General: 66 y.o. male in no acute distress. HEENT: Normocephalic and atraumatic. Sclera clear. EOMs intact. Neck: Supple. No carotid bruits. No JVD. Heart: *** RRR. Distinct S1 and S2. No murmurs, gallops, or rubs. Radial and distal pedal pulses 2+ and equal  bilaterally. Lungs: No increased work of breathing. Clear to ausculation bilaterally. No wheezes, rhonchi, or rales.  Abdomen: Soft, non-distended, and non-tender to palpation. Bowel sounds present in all 4 quadrants.  MSK: Normal strength and tone for age. *** Extremities: No lower extremity edema.    Skin: Warm and dry. Neuro: Alert and oriented x3. No focal deficits. Psych: Normal affect. Responds appropriately.   Assessment:    No diagnosis found.  Plan:     Disposition: Follow up in ***   Medication Adjustments/Labs and Tests Ordered: Current medicines are reviewed at length with the patient today.  Concerns regarding medicines are outlined above.  No orders of the defined types were placed in this encounter.  No orders of the defined types were placed in this encounter.   There are no Patient Instructions on file for this visit.   Signed, Corrin Parker, PA-C  08/23/2022 10:56 AM    [facility]

## 2022-08-28 ENCOUNTER — Ambulatory Visit: Payer: Medicare Other | Admitting: Student

## 2022-09-10 DIAGNOSIS — I2510 Atherosclerotic heart disease of native coronary artery without angina pectoris: Secondary | ICD-10-CM | POA: Insufficient documentation

## 2022-09-10 NOTE — Progress Notes (Unsigned)
Cardiology Office Note:    Date:  09/11/2022   ID:  SULEYMAN EHRMAN, DOB 07/24/1956, MRN [id number]  PCP:  Patient, No Pcp Per [facility] Cardiologist: Parke Poisson, MD   Reason for visit: Follow-up ACS/ PCI  History of Present Illness:    Joshua Gomez is a 66 y.o. male with a hx of hypertension, hyperlipidemia and CAD.  Patient was hospitalized September 4-6 2023 with chest pain and NSTEMI.  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  He underwent PCI to the LAD and first OM.  He has residual moderate disease in the distal AV circumflex and nondominant RCA, treated medically.  2D echo with EF 60-65%, mild mitral regurgitation, mild to moderate aortic regurgitation.  With LDL 55, patient enrolled in Evolve MI with addition of Evolocumab to lipid therapy.  LHC 08/20/22 with 1.  Patent left main with mild nonobstructive disease 2.  Severe proximal to mid LAD stenosis, treated with a 3.0 x 38 mm Onyx frontier DES, reducing 95% stenosis to 0% post procedure 3.  Total occlusion of the first OM branch of the circumflex, treated with PCI using a 3.0 x 38 mm Onyx frontier DES, reducing 100% stenosis to 0% post procedure 4.  Moderate residual disease in the distal AV circumflex (dominant vessel) and nondominant RCA, both lesions appropriate for medical therapy.  Today, patient has done well with no further chest pain.  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  He had no chest pain yesterday while lifting 100 bales of hay.  He also denies shortness of breath, PND, orthopnea, lower extremity edema, lightheadedness and syncope.  His only complaint is several episodes of nose bleeding with Brilinta.  He is currently only taking Brilinta once a day.  He has continued taking his other medications including a baby aspirin.      Past Medical History:  Diagnosis Date   Arthritis    BPH (benign prostatic  hypertrophy)    Feeling of incomplete bladder emptying    History of kidney stones    History of MRSA infection    2005--  left lower leg abscess w/ MRSA   Idiopathic peripheral neuropathy    Lower urinary tract symptoms (LUTS)    Wears glasses    Wears hearing aid    BILATERAL    Past Surgical History:  Procedure Laterality Date   CORONARY STENT INTERVENTION N/A 08/20/2022   Procedure: CORONARY STENT INTERVENTION;  Surgeon: Tonny Bollman, MD;  Location: [facility] INVASIVE CV LAB;  Service: Cardiovascular;  Laterality: N/A;   CYSTO/  BILATERAL RETROGRADE PYELOGRAM  12-02-2005   LEFT HEART CATH AND CORONARY ANGIOGRAPHY N/A 08/20/2022   Procedure: LEFT HEART CATH AND CORONARY ANGIOGRAPHY;  Surgeon: Tonny Bollman, MD;  Location: [facility] INVASIVE CV LAB;  Service: Cardiovascular;  Laterality: N/A;   TRANSURETHRAL RESECTION OF PROSTATE N/A 07/24/2015   Procedure: TRANSURETHRAL RESECTION OF THE PROSTATE (TURP) WITH GYRUS;  Surgeon: Ihor Gully, MD;  Location: [facility];  Service: Urology;  Laterality: N/A;    Current Medications: Current Meds  Medication Sig   acetaminophen (TYLENOL) 325 MG tablet Take 2 tablets (650 mg total) by mouth every 4 (four) hours as needed for mild pain or moderate pain.   ascorbic acid (VITAMIN C) 500 MG tablet Take 500 mg by mouth daily.   aspirin EC 81 MG tablet Take 81 mg by mouth daily.   [facility] SR)  150 MG 12 hr tablet Take 150 mg by mouth daily.   clopidogrel (PLAVIX) 75 MG tablet Take 1 tablet (75 mg total) by mouth daily.   DULoxetine (CYMBALTA) 20 MG capsule Take 20 mg by mouth daily.   fluticasone (FLONASE) 50 MCG/ACT nasal spray Place 2 sprays into both nostrils daily as needed for allergies.   losartan (COZAAR) 25 MG tablet Take 1/2 tablet (12.5 mg total) by mouth daily.   Multiple Vitamin (MULTIVITAMIN ADULT PO) Take 1 tablet by mouth daily.   nitroGLYCERIN (NITROSTAT) 0.4 MG SL tablet Place 1 tablet (0.4 mg total) under the  tongue every 5 (five) minutes x 3 doses as needed for chest pain.   rosuvastatin (CRESTOR) 20 MG tablet Take 1 tablet (20 mg total) by mouth daily.   Study - EVOLVE-MI - evolocumab (REPATHA) 140 mg/mL SQ injection (PI-Stuckey) Inject 1 mL (140 mg total) into the skin every 14 (fourteen) days. For Investigational Use Only. Inject subcutaneously into abdomen, thigh, or upper arm every 14 days. Rotate injection sites and do not inject into areas where skin is tender, bruised, or red. Please contact [facility] for any questions or concerns regarding this medication.   tadalafil (CIALIS) 5 MG tablet TAKE ONE TABLET BY MOUTH DAILY FOR PROSTATE   [DISCONTINUED] metoprolol tartrate (LOPRESSOR) 25 MG tablet Take 1 tablet (25 mg total) by mouth 2 (two) times daily.   [DISCONTINUED] ticagrelor (BRILINTA) 90 MG TABS tablet Take 1 tablet (90 mg total) by mouth 2 (two) times daily. (Patient taking differently: Take 90 mg by mouth daily.)     Allergies:   Patient has no known allergies.   Social History   Socioeconomic History   Marital status: Married    Spouse name: Not on file   Number of children: Not on file   Years of education: Not on file   Highest education level: Not on file  Occupational History   Not on file  Tobacco Use   Smoking status: Never   Smokeless tobacco: Never  Substance and Sexual Activity   Alcohol use: No   Drug use: No   Sexual activity: Not on file  Other Topics Concern   Not on file  Social History Narrative   Not on file   Social Determinants of Health   Financial Resource Strain: Not on file  Food Insecurity: Not on file  Transportation Needs: Not on file  Physical Activity: Not on file  Stress: Not on file  Social Connections: Not on file     Family History: The patient's family history is not on file.  ROS:   Please see the history of present illness.     EKGs/Labs/Other Studies Reviewed:    EKG:  The ekg ordered today demonstrates  sinus bradycardia with sinus arrhythmia with heart rate 46.  Recent Labs: 08/19/2022: ALT 21; Magnesium 1.9; TSH 0.772 08/21/2022: BUN 15; Creatinine, Ser 0.82; Hemoglobin 15.2; Platelets 185; Potassium 3.5; Sodium 137   Recent Lipid Panel Lab Results  Component Value Date/Time   CHOL 114 08/21/2022 04:34 AM   TRIG 129 08/21/2022 04:34 AM   HDL 33 (L) 08/21/2022 04:34 AM   LDLCALC 55 08/21/2022 04:34 AM    Physical Exam:    VS:  BP 116/68   Pulse (!) 46   Ht 5\' 11"  (1.803 m)   Wt 274 lb 3.2 oz (124.4 kg)   SpO2 98%   BMI 38.24 kg/m    No data found.  Wt Readings from Last 3 Encounters:  09/11/22 274 lb 3.2 oz (124.4 kg)  08/20/22 269 lb 4.8 oz (122.2 kg)  07/24/15 290 lb (131.5 kg)     GEN:  Well nourished, well developed in no acute distress HEENT: Normal NECK: No JVD; No carotid bruits CARDIAC: RRR, no murmurs, rubs, gallops RESPIRATORY:  Clear to auscultation without rales, wheezing or rhonchi  ABDOMEN: Soft, non-tender, non-distended MUSCULOSKELETAL: No edema; No deformity  SKIN: Warm and dry NEUROLOGIC:  Alert and oriented PSYCHIATRIC:  Normal affect     ASSESSMENT AND PLAN   Coronary artery disease, no angina -NSTEMI September 2023 with PCI to LAD and OM1; residual left circumflex and RCA lesions - tx medically.  Normal EF. -With significant nosebleeding, recommend switching Brilinta to Plavix.  Told him to load Plavix with 300 mg tomorrow and then 75 mg daily thereafter.  Continue aspirin 81 mg daily.  Avoid NSAIDs.  Discuss necessity of med compliance with DAPT. -With heart rates in the 40s, will discontinue Lopressor 25 mg twice daily. -Recommend cardiac rehab. -Continue lipid therapy with goal LDL less than 55.  Patient had LDL 55 at time of heart attack. -Recommend weight loss.  Valvular heart disease -2D echo September 2023 with mild MR, mild-mod AI -Repeat echo in 2 years -No heart failure symptoms.  Hypertension, well controlled -Patient  states blood pressure was elevated prior to admission. -Continue losartan 12.5 mg daily.  Patient had normal K and creatinine this month. -Goal BP is <130/80.  Recommend DASH diet (high in vegetables, fruits, low-fat dairy products, whole grains, poultry, fish, and nuts and low in sweets, sugar-sweetened beverages, and red meats), salt restriction and increase physical activity.  Hyperlipidemia with goal LDL less than 55 -With LDL 55 at the of MI in 08/2022, patient enrolled in Evolve MI with addition of Evolocumab to lipid therapy (Crestor 20 mg daily) -Discussed cholesterol lowering diets - Mediterranean diet, DASH diet, vegetarian diet, low-carbohydrate diet and avoidance of trans fats.  Discussed healthier choice substitutes.  Nuts, high-fiber foods, and fiber supplements may also improve lipids.    Obesity -Discussed how even a 5-10% weight loss can have cardiovascular benefits.   -Recommend moderate intensity activity for 30 minutes 5 days/week and the DASH diet.  Disposition - Follow-up in 3 months with Dr. Jacques Navy.   Medication Adjustments/Labs and Tests Ordered: Current medicines are reviewed at length with the patient today.  Concerns regarding medicines are outlined above.  Orders Placed This Encounter  Procedures   AMB referral to cardiac rehabilitation   EKG 12-Lead   Meds ordered this encounter  Medications   clopidogrel (PLAVIX) 75 MG tablet    Sig: Take 1 tablet (75 mg total) by mouth daily.    Dispense:  90 tablet    Refill:  3    Patient Instructions  Medication Instructions:  STOP Brilinta  STOP Metoprolol   START Plavix 75 mg daily  You will take 4 tablets (300 mg) tomorrow- then you will take 1 tablet every day after.  *If you need a refill on your cardiac medications before your next appointment, please call your pharmacy*   Follow-Up: At [facility], you and your health needs are our priority.  As part of our continuing mission to provide you  with exceptional heart care, we have created designated Provider Care Teams.  These Care Teams include your primary Cardiologist (physician) and Advanced Practice Providers (APPs -  Physician Assistants and Nurse Practitioners) who all work together to  provide you with the care you need, when you need it.  We recommend signing up for the patient portal called "MyChart".  Sign up information is provided on this After Visit Summary.  MyChart is used to connect with patients for Virtual Visits (Telemedicine).  Patients are able to view lab/test results, encounter notes, upcoming appointments, etc.  Non-urgent messages can be sent to your provider as well.   To learn more about what you can do with MyChart, go to [email or web address].    Your next appointment:   3-4 month(s)  The format for your next appointment:   In Person  Provider:   Parke Poisson, MD    Cardiac Rehab referral sent- okay to start when they call.   Thanks!        Signed, Cannon Kettle, PA-C  09/11/2022 10:15 AM    [facility]

## 2022-09-11 ENCOUNTER — Ambulatory Visit: Payer: Medicare Other | Attending: Student | Admitting: Physician Assistant

## 2022-09-11 ENCOUNTER — Telehealth: Payer: Self-pay | Admitting: Physician Assistant

## 2022-09-11 ENCOUNTER — Encounter: Payer: Self-pay | Admitting: Physician Assistant

## 2022-09-11 VITALS — BP 116/68 | HR 46 | Ht 71.0 in | Wt 274.2 lb

## 2022-09-11 DIAGNOSIS — I10 Essential (primary) hypertension: Secondary | ICD-10-CM

## 2022-09-11 DIAGNOSIS — I2510 Atherosclerotic heart disease of native coronary artery without angina pectoris: Secondary | ICD-10-CM | POA: Diagnosis not present

## 2022-09-11 DIAGNOSIS — E785 Hyperlipidemia, unspecified: Secondary | ICD-10-CM | POA: Diagnosis not present

## 2022-09-11 MED ORDER — CLOPIDOGREL BISULFATE 75 MG PO TABS: 75.0000 mg | ORAL_TABLET | Freq: Every day | ORAL | 3 refills | Status: DC

## 2022-09-11 NOTE — Patient Instructions (Addendum)
Medication Instructions:  STOP Brilinta  STOP Metoprolol   START Plavix 75 mg daily  You will take 4 tablets (300 mg) tomorrow- then you will take 1 tablet every day after.  *If you need a refill on your cardiac medications before your next appointment, please call your pharmacy*   Follow-Up: At [facility], you and your health needs are our priority.  As part of our continuing mission to provide you with exceptional heart care, we have created designated Provider Care Teams.  These Care Teams include your primary Cardiologist (physician) and Advanced Practice Providers (APPs -  Physician Assistants and Nurse Practitioners) who all work together to provide you with the care you need, when you need it.  We recommend signing up for the patient portal called "MyChart".  Sign up information is provided on this After Visit Summary.  MyChart is used to connect with patients for Virtual Visits (Telemedicine).  Patients are able to view lab/test results, encounter notes, upcoming appointments, etc.  Non-urgent messages can be sent to your provider as well.   To learn more about what you can do with MyChart, go to [email or web address].    Your next appointment:   3-4 month(s)  The format for your next appointment:   In Person  Provider:   Elouise Munroe, MD    Cardiac Rehab referral sent- okay to start when they call.   Thanks!

## 2022-09-11 NOTE — Telephone Encounter (Signed)
Spouse asked why plavix wasn't called in to [facility]. Explained prescription was sent to [facility]. She stated she will call and have prescription transferred over to [facility].

## 2022-09-11 NOTE — Telephone Encounter (Signed)
Pt c/o medication issue:  1. Name of Medication:   clopidogrel (PLAVIX) 75 MG tablet  2. How are you currently taking this medication (dosage and times per day)?   Not started yet  3. Are you having a reaction (difficulty breathing--STAT)?  N/A  4. What is your medication issue?   Wife stated this prescription did not get sent to the pharmacy ([facility], [place] - [address]).  Wife stated the patient would need to get this prescription soon as he will be going out of town this evening.

## 2022-09-18 ENCOUNTER — Other Ambulatory Visit (HOSPITAL_COMMUNITY): Payer: Self-pay

## 2022-09-18 ENCOUNTER — Telehealth: Payer: Self-pay | Admitting: Internal Medicine

## 2022-09-18 NOTE — Telephone Encounter (Signed)
Returned call to pt. No answer. Left msg to call back.  

## 2022-09-18 NOTE — Telephone Encounter (Signed)
Wife is calling in because for the last 3 days patient has had a nose bleed off and on. Calling to see what can be done. Please advise

## 2022-09-19 NOTE — Telephone Encounter (Signed)
Left another message for the pt to call back.  

## 2022-09-25 NOTE — Telephone Encounter (Signed)
Spoke with pt, he is currently not having any problems. Encouraged him to use saline nasal spray or humidifier to help moisten his nasal passages.

## 2022-10-14 ENCOUNTER — Other Ambulatory Visit (HOSPITAL_COMMUNITY): Payer: Self-pay

## 2022-12-18 ENCOUNTER — Encounter: Payer: Self-pay | Admitting: *Deleted

## 2022-12-18 DIAGNOSIS — Z006 Encounter for examination for normal comparison and control in clinical research program: Secondary | ICD-10-CM

## 2022-12-18 NOTE — Research (Signed)
Follow-Up Visit Completed*   []  Not Necessary, No Potential Adverse Events Or Medication Issues Reported On Completed Subject Questionnaire   [x]  Yes, Contact With Subject/Alternate Contact Completed   []  Yes, No Contact With Subject/Alternate Contact Completed, But Electronic Health Record Was Reviewed   []  No, Unable To Contact Subject/Alternate Contact   Have you reviewed Ongoing medications on the Targeted Concomitant Medication form and updated the form as needed?   [x]  Yes   []  No   Subject Status*   [x]  Continuing In Follow-up   []  At Risk For Lost To Follow-up   []  Withdrawal From All Future Study Activities Including Passive Follow-up By [place] Record Review Or Contact With Healthcare Provider Or Family Member/Friend   []  Death   Vital Status*   [x]  Alive   []  Deceased   []  Unknown   Last Known To Be Alive Source*   [x]  Subject Completed Follow-up Questionnaire/Seen In Person/Via Telephone Contact   []  Family Member or Caretaker   []  Primary Physician Or Medical Records   []  Publicly Available Source   []  Other  Date of last dose taken   05-Dec-2022 Over the last 12 weeks did the subject miss any doses?no   Over the last 12 weeks did the subject restart Evolocumab after an interruption? no

## 2023-01-13 ENCOUNTER — Ambulatory Visit: Payer: Medicare Other | Attending: Internal Medicine | Admitting: Internal Medicine

## 2023-01-13 VITALS — BP 138/78 | HR 64 | Ht 72.0 in | Wt 275.2 lb

## 2023-01-13 DIAGNOSIS — I2510 Atherosclerotic heart disease of native coronary artery without angina pectoris: Secondary | ICD-10-CM

## 2023-01-13 DIAGNOSIS — Z006 Encounter for examination for normal comparison and control in clinical research program: Secondary | ICD-10-CM | POA: Diagnosis not present

## 2023-01-13 DIAGNOSIS — E785 Hyperlipidemia, unspecified: Secondary | ICD-10-CM

## 2023-01-13 DIAGNOSIS — I214 Non-ST elevation (NSTEMI) myocardial infarction: Secondary | ICD-10-CM

## 2023-01-13 DIAGNOSIS — I249 Acute ischemic heart disease, unspecified: Secondary | ICD-10-CM

## 2023-01-13 DIAGNOSIS — I10 Essential (primary) hypertension: Secondary | ICD-10-CM | POA: Diagnosis not present

## 2023-01-13 NOTE — Patient Instructions (Signed)
Medication Instructions:  No Changes In Medications at this time.  *If you need a refill on your cardiac medications before your next appointment, please call your pharmacy*  Follow-Up: At [facility], you and your health needs are our priority.  As part of our continuing mission to provide you with exceptional heart care, we have created designated Provider Care Teams.  These Care Teams include your primary Cardiologist (physician) and Advanced Practice Providers (APPs -  Physician Assistants and Nurse Practitioners) who all work together to provide you with the care you need, when you need it.  Your next appointment:   3 month(s)  Provider:   Elouise Munroe, MD

## 2023-02-04 ENCOUNTER — Telehealth: Payer: Self-pay | Admitting: Internal Medicine

## 2023-02-04 ENCOUNTER — Encounter: Payer: Self-pay | Admitting: *Deleted

## 2023-02-04 DIAGNOSIS — Z006 Encounter for examination for normal comparison and control in clinical research program: Secondary | ICD-10-CM

## 2023-02-04 MED ORDER — REPATHA SURECLICK 140 MG/ML ~~LOC~~ SOAJ: 140.0000 mg | SUBCUTANEOUS | 3 refills | Status: AC

## 2023-02-04 NOTE — Telephone Encounter (Signed)
Pt c/o medication issue:  1. Name of Medication: Study - EVOLVE-MI - evolocumab (REPATHA) 140 mg/mL SQ injection (PI-Stuckey)   2. How are you currently taking this medication (dosage and times per day)? As prescribed   3. Are you having a reaction (difficulty breathing--STAT)? No   4. What is your medication issue? Patient's wife is calling stating the patient is due for this injection next weekend, but they are unsure on how to get the medication. She reports he was sent home with it from the hospital and it advises to [place] for future refills. Please advise.

## 2023-02-04 NOTE — Telephone Encounter (Signed)
Left message for patient will forward message to research staff.

## 2023-02-04 NOTE — Research (Signed)
ORDERS ONLY

## 2023-02-09 ENCOUNTER — Encounter: Payer: Self-pay | Admitting: Internal Medicine

## 2023-02-09 NOTE — Progress Notes (Signed)
Cardiology Office Note:    Date:  01/13/2023   ID:  VERLEY Gomez, DOB [birth year], MRN [id number]  PCP:  Patient, No Pcp Per [place] Cardiologist: Elouise Munroe, MD   Reason for visit: Follow-up ACS/ PCI  History of Present Illness:    Joshua Gomez is a 67 y.o. male with a hx of hypertension, hyperlipidemia and CAD.  Patient was hospitalized September 4-6 2023 with chest pain and NSTEMI.  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  He underwent PCI to the LAD and first OM.  He has residual moderate disease in the distal AV circumflex and nondominant RCA, treated medically.  2D echo with EF 60-65%, mild mitral regurgitation, mild to moderate aortic regurgitation.  With LDL 55, patient enrolled in Evolve MI with addition of Evolocumab to lipid therapy.  LHC 08/20/22 with 1.  Patent left main with mild nonobstructive disease 2.  Severe proximal to mid LAD stenosis, treated with a 3.0 x 38 mm Onyx frontier DES, reducing 95% stenosis to 0% post procedure 3.  Total occlusion of the first OM branch of the circumflex, treated with PCI using a 3.0 x 38 mm Onyx frontier DES, reducing 100% stenosis to 0% post procedure 4.  Moderate residual disease in the distal AV circumflex (dominant vessel) and nondominant RCA, both lesions appropriate for medical therapy.  Today, patient has done well with no further chest pain.  We discussed several issues raised by primary care.  They hope to increase the dose of Wellbutrin and wonder how this will affect his blood pressure we decided in shared decision making that it is reasonable to attempt this if they would like to increase the dose for noncardiac reasons, however if the Wellbutrin increases his blood pressure would be reasonable to increase his dose of losartan as he is only on 12-1/2 mg daily.  If there is no change in blood pressure after changing doses of Wellbutrin, reasonable to make no  changes as his blood pressure is reasonably controlled though the goal is 130/80 or less.  We discussed risk of NSAID use in the setting of prior ACS.  He is doing well on his current therapy without other issues.  He is enrolled in the involve MRI study taking evolocumab.    Past Medical History:  Diagnosis Date   Arthritis    BPH (benign prostatic hypertrophy)    Feeling of incomplete bladder emptying    History of kidney stones    History of MRSA infection    2005--  left lower leg abscess w/ MRSA   Idiopathic peripheral neuropathy    Lower urinary tract symptoms (LUTS)    Wears glasses    Wears hearing aid    BILATERAL    Past Surgical History:  Procedure Laterality Date   CORONARY STENT INTERVENTION N/A 08/20/2022   Procedure: CORONARY STENT INTERVENTION;  Surgeon: Joshua Mocha, MD;  Location: [place] [facility];  Service: Cardiovascular;  Laterality: N/A;   CYSTO/  BILATERAL RETROGRADE PYELOGRAM  12-02-2005   LEFT HEART CATH AND CORONARY ANGIOGRAPHY N/A 08/20/2022   Procedure: LEFT HEART CATH AND CORONARY ANGIOGRAPHY;  Surgeon: Joshua Mocha, MD;  Location: [place] [facility];  Service: Cardiovascular;  Laterality: N/A;   TRANSURETHRAL RESECTION OF PROSTATE N/A 07/24/2015   Procedure: TRANSURETHRAL RESECTION OF THE PROSTATE (TURP) WITH GYRUS;  Surgeon: Joshua Rhodes, MD;  Location: [place];  Service: Urology;  Laterality: N/A;  Current Medications: Current Meds  Medication Sig   acetaminophen (TYLENOL) 325 MG tablet Take 2 tablets (650 mg total) by mouth every 4 (four) hours as needed for mild pain or moderate pain.   ascorbic acid (VITAMIN C) 500 MG tablet Take 500 mg by mouth daily.   aspirin EC 81 MG tablet Take 81 mg by mouth daily.   buPROPion (WELLBUTRIN SR) 150 MG 12 hr tablet Take 150 mg by mouth daily.   clopidogrel (PLAVIX) 75 MG tablet Take 1 tablet (75 mg total) by mouth daily.   DULoxetine (CYMBALTA) 20 MG capsule Take 20 mg by mouth daily.    fluticasone (FLONASE) 50 MCG/ACT nasal spray Place 2 sprays into both nostrils daily as needed for allergies.   losartan (COZAAR) 25 MG tablet Take 1/2 tablet (12.5 mg total) by mouth daily.   Multiple Vitamin (MULTIVITAMIN ADULT PO) Take 1 tablet by mouth daily.   naproxen (NAPROSYN) 250 MG tablet Take 250 mg by mouth as needed for mild pain or moderate pain.   nitroGLYCERIN (NITROSTAT) 0.4 MG SL tablet Place 1 tablet (0.4 mg total) under the tongue every 5 (five) minutes x 3 doses as needed for chest pain.   Study - EVOLVE-MI - evolocumab (REPATHA) 140 mg/mL SQ injection (PI-Stuckey) Inject 1 mL (140 mg total) into the skin every 14 (fourteen) days. For Investigational Use Only. Inject subcutaneously into abdomen, thigh, or upper arm every 14 days. Rotate injection sites and do not inject into areas where skin is tender, bruised, or red. Please contact [place] for any questions or concerns regarding this medication.   tadalafil (CIALIS) 5 MG tablet TAKE ONE TABLET BY MOUTH DAILY FOR PROSTATE   terbinafine (LAMISIL) 250 MG tablet Take 250 mg by mouth daily.     Allergies:   Patient has no known allergies.   Social History   Socioeconomic History   Marital status: Married    Spouse name: Not on file   Number of children: Not on file   Years of education: Not on file   Highest education level: Not on file  Occupational History   Not on file  Tobacco Use   Smoking status: Never   Smokeless tobacco: Never  Substance and Sexual Activity   Alcohol use: No   Drug use: No   Sexual activity: Not on file  Other Topics Concern   Not on file  Social History Narrative   Not on file   Social Determinants of Health   Financial Resource Strain: Not on file  Food Insecurity: Not on file  Transportation Needs: Not on file  Physical Activity: Not on file  Stress: Not on file  Social Connections: Not on file     Family History: The patient's family history is not on  file.  ROS:   Please see the history of present illness.     EKGs/Labs/Other Studies Reviewed:    EKG:  na  Recent Labs: 08/19/2022: ALT 21; Magnesium 1.9; TSH 0.772 08/21/2022: BUN 15; Creatinine, Ser 0.82; Hemoglobin 15.2; Platelets 185; Potassium 3.5; Sodium 137   Recent Lipid Panel Lab Results  Component Value Date/Time   CHOL 114 08/21/2022 04:34 AM   TRIG 129 08/21/2022 04:34 AM   HDL 33 (L) 08/21/2022 04:34 AM   LDLCALC 55 08/21/2022 04:34 AM    Physical Exam:    VS:  BP 138/78   Pulse 64   Ht 6' (1.829 m)   Wt 275 lb 3.2 oz (124.8 kg)  SpO2 96%   BMI 37.32 kg/m    No data found.   Wt Readings from Last 3 Encounters:  01/13/23 275 lb 3.2 oz (124.8 kg)  09/11/22 274 lb 3.2 oz (124.4 kg)  08/20/22 269 lb 4.8 oz (122.2 kg)    Constitutional: No acute distress Eyes: sclera non-icteric, normal conjunctiva and lids ENMT: normal dentition, moist mucous membranes Cardiovascular: regular rhythm, normal rate, no murmur. S1 and S2 normal. No jugular venous distention.  Respiratory: clear to auscultation bilaterally GI : normal bowel sounds, soft and nontender. No distention.   MSK: extremities warm, well perfused. No edema.  NEURO: grossly nonfocal exam, moves all extremities. PSYCH: alert and oriented x 3, normal mood and affect.     ASSESSMENT AND PLAN   1. Coronary artery disease involving native coronary artery of native heart without angina pectoris   2. Hyperlipidemia LDL goal <70   3. Hypertension, unspecified type   4. Research subject   5. Non-ST elevation (NSTEMI) myocardial infarction ([place])   6. ACS (acute coronary syndrome) [facility])      Coronary artery disease, no angina -NSTEMI September 2023 with PCI to LAD and OM1; residual left circumflex and RCA lesions - tx medically.  Normal EF. -With significant nosebleeding, Brilinta was switched to Plavix, he is doing well on this.  Continue aspirin 81 mg daily.  Avoid NSAIDs.  Discuss necessity of med  compliance with DAPT.  Indication for DAPT is ACS, continue both aspirin and Plavix for 1 year, will determine monotherapy in September 2024. -With heart rates in the 40s, was stopped at last visit -Continue lipid therapy with goal LDL less than 55.  Patient had LDL 55 at time of MI. -Continue aggressive diet and lifestyle modifications for secondary prevention of CAD.  Valvular heart disease -2D echo September 2023 with mild MR, mild-mod AI Repeat echo in 1 to 2 years.  Hypertension, well controlled -Patient states blood pressure was elevated prior to admission. -Continue losartan 12.5 mg daily.  -Goal BP is <130/80.  As above, if changes in medication therapy pursued by primary care result in increased blood pressure, I would consider increasing his losartan to at least 25 mg daily to benefit antihypertensive BP goals.  Hyperlipidemia with goal LDL less than 55 -With LDL 55 at the of MI in 08/2022, patient enrolled in Evolve MI with addition of Evolocumab to lipid therapy.  No longer taking Crestor due to myalgias. -Continue pursue diet lifestyle modification.  Total time of encounter: 30 minutes total time of encounter, including 25 minutes spent in face-to-face patient care on the date of this encounter. This time includes coordination of care and counseling regarding above mentioned problem list. Remainder of non-face-to-face time involved reviewing chart documents/testing relevant to the patient encounter and documentation in the medical record. I have independently reviewed documentation from referring provider.   Cherlynn Kaiser, MD, [facility]     Medication Adjustments/Labs and Tests Ordered: Current medicines are reviewed at length with the patient today.  Concerns regarding medicines are outlined above.  No orders of the defined types were placed in this encounter.  No orders of the defined types were placed in this encounter.   Patient Instructions   Medication Instructions:  No Changes In Medications at this time.  *If you need a refill on your cardiac medications before your next appointment, please call your pharmacy*  Follow-Up: At [facility], you and your health needs are our priority.  As part  of our continuing mission to provide you with exceptional heart care, we have created designated Provider Care Teams.  These Care Teams include your primary Cardiologist (physician) and Advanced Practice Providers (APPs -  Physician Assistants and Nurse Practitioners) who all work together to provide you with the care you need, when you need it.  Your next appointment:   3 month(s)  Provider:   Elouise Munroe, MD

## 2023-03-18 ENCOUNTER — Telehealth: Payer: Self-pay | Admitting: *Deleted

## 2023-03-18 NOTE — Telephone Encounter (Signed)
   Name: Joshua Gomez  DOB: [birth year]  MRN: [id number]  Primary Cardiologist: Elouise Munroe, MD  Chart reviewed as part of pre-operative protocol coverage. The patient has an upcoming visit scheduled with Dr. Margaretann Loveless on 04/15/23 at which time clearance can be addressed in case there are any issues that would impact surgical recommendations.   I will route this message as FYI to requesting party and remove this message from the preop box as separate preop APP input not needed at this time.   Please call with any questions.  Mable Fill, Marissa Nestle, NP  03/18/2023, 9:16 AM

## 2023-03-18 NOTE — Telephone Encounter (Signed)
   Pre-operative Risk Assessment    Patient Name: Joshua Gomez  DOB: June 09, 1956 MRN: [id number]      Request for Surgical Clearance    Procedure:   Right total hip arthroplasty.  Date of Surgery:  Clearance 07/02/23                                 Surgeon:  Dr Gaynelle Arabian. Surgeon's Group or Practice Name:  [facility] Phone number:  [phone] Fax number:  [phone]   Type of Clearance Requested:   - Medical  - Pharmacy:  Hold Aspirin and Clopidogrel (Plavix) Not Indicated.   Type of Anesthesia:   Choice   Additional requests/questions:    Signed, Greer Ee   03/18/2023, 9:11 AM

## 2023-04-14 NOTE — Progress Notes (Unsigned)
Cardiology Office Note:    Date:  04/15/2023   ID:  Joshua Gomez, DOB 06-20-1956, MRN [id number]  PCP:  Patient, No Pcp Per [facility] Cardiologist: Parke Poisson, MD   Reason for visit: Follow-up ACS/ PCI  History of Present Illness:    Joshua Gomez is a 67 y.o. male with a hx of hypertension, hyperlipidemia and CAD.  Patient was hospitalized September 4-6 2023 with chest pain and NSTEMI.  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  He underwent PCI to the LAD and first OM.  He has residual moderate disease in the distal AV circumflex and nondominant RCA, treated medically.  2D echo with EF 60-65%, mild mitral regurgitation, mild to moderate aortic regurgitation.  With LDL 55, patient enrolled in Evolve MI with addition of Evolocumab to lipid therapy.  Today: Feels well overall.  He does however have debilitating right hip pain and is anticipating surgery with Dr. Antony Odea 07/02/2023 for right hip arthroplasty.  He believes he was told in our office to schedule his surgery in July, however I informed him that I would have recommended September and I am uncertain of where the information about July originated.  Continues on DAPT without complication and does have minor arm bruising.  LHC 08/20/22 with 1.  Patent left main with mild nonobstructive disease 2.  Severe proximal to mid LAD stenosis, treated with a 3.0 x 38 mm Onyx frontier DES, reducing 95% stenosis to 0% post procedure 3.  Total occlusion of the first OM branch of the circumflex, treated with PCI using a 3.0 x 38 mm Onyx frontier DES, reducing 100% stenosis to 0% post procedure 4.  Moderate residual disease in the distal AV circumflex (dominant vessel) and nondominant RCA, both lesions appropriate for medical therapy.  Last visit: Patient has done well with no further chest pain.  We discussed several issues raised by primary care.  They hope to increase the dose of  Wellbutrin and wonder how this will affect his blood pressure we decided in shared decision making that it is reasonable to attempt this if they would like to increase the dose for noncardiac reasons, however if the Wellbutrin increases his blood pressure would be reasonable to increase his dose of losartan as he is only on 12-1/2 mg daily.  If there is no change in blood pressure after changing doses of Wellbutrin, reasonable to make no changes as his blood pressure is reasonably controlled though the goal is 130/80 or less.  We discussed risk of NSAID use in the setting of prior ACS.  He is doing well on his current therapy without other issues.  He is enrolled in the involve MRI study taking evolocumab.    Past Medical History:  Diagnosis Date   Arthritis    BPH (benign prostatic hypertrophy)    Feeling of incomplete bladder emptying    History of kidney stones    History of MRSA infection    2005--  left lower leg abscess w/ MRSA   Idiopathic peripheral neuropathy    Lower urinary tract symptoms (LUTS)    Wears glasses    Wears hearing aid    BILATERAL    Past Surgical History:  Procedure Laterality Date   CORONARY STENT INTERVENTION N/A 08/20/2022   Procedure: CORONARY STENT INTERVENTION;  Surgeon: Tonny Bollman, MD;  Location: [facility];  Service: Cardiovascular;  Laterality: N/A;   CYSTO/  BILATERAL RETROGRADE PYELOGRAM  12-02-2005  LEFT HEART CATH AND CORONARY ANGIOGRAPHY N/A 08/20/2022   Procedure: LEFT HEART CATH AND CORONARY ANGIOGRAPHY;  Surgeon: Tonny Bollman, MD;  Location: [facility] INVASIVE [facility] LAB;  Service: Cardiovascular;  Laterality: N/A;   TRANSURETHRAL RESECTION OF PROSTATE N/A 07/24/2015   Procedure: TRANSURETHRAL RESECTION OF THE PROSTATE (TURP) WITH GYRUS;  Surgeon: Ihor Gully, MD;  Location: [facility];  Service: Urology;  Laterality: N/A;    Current Medications: Current Meds  Medication Sig   acetaminophen (TYLENOL) 325 MG tablet Take 2  tablets (650 mg total) by mouth every 4 (four) hours as needed for mild pain or moderate pain.   ascorbic acid (VITAMIN C) 500 MG tablet Take 500 mg by mouth daily.   aspirin EC 81 MG tablet Take 81 mg by mouth daily.   clopidogrel (PLAVIX) 75 MG tablet Take 1 tablet (75 mg total) by mouth daily.   DULoxetine (CYMBALTA) 20 MG capsule Take 20 mg by mouth daily.   fluticasone (FLONASE) 50 MCG/ACT nasal spray Place 2 sprays into both nostrils daily as needed for allergies.   losartan (COZAAR) 50 MG tablet Take by mouth.   metFORMIN (GLUCOPHAGE) 500 MG tablet Take by mouth.   Multiple Vitamin (MULTIVITAMIN ADULT PO) Take 1 tablet by mouth daily.   naproxen (NAPROSYN) 250 MG tablet Take 250 mg by mouth as needed for mild pain or moderate pain.   REPATHA SURECLICK 140 MG/ML SOAJ Inject 140 mg into the skin every 14 (fourteen) days for 12 doses.   Study - EVOLVE-MI - evolocumab (REPATHA) 140 mg/mL SQ injection (PI-Stuckey) Inject 1 mL (140 mg total) into the skin every 14 (fourteen) days. For Investigational Use Only. Inject subcutaneously into abdomen, thigh, or upper arm every 14 days. Rotate injection sites and do not inject into areas where skin is tender, bruised, or red. Please contact [facility] for any questions or concerns regarding this medication.     Allergies:   Patient has no known allergies.   Social History   Socioeconomic History   Marital status: Married    Spouse name: Not on file   Number of children: Not on file   Years of education: Not on file   Highest education level: Not on file  Occupational History   Not on file  Tobacco Use   Smoking status: Never   Smokeless tobacco: Never  Substance and Sexual Activity   Alcohol use: No   Drug use: No   Sexual activity: Not on file  Other Topics Concern   Not on file  Social History Narrative   Not on file   Social Determinants of Health   Financial Resource Strain: Not on file  Food Insecurity: Not on  file  Transportation Needs: Not on file  Physical Activity: Not on file  Stress: Not on file  Social Connections: Not on file     Family History: The patient's family history is not on file.  ROS:   Please see the history of present illness.     EKGs/Labs/Other Studies Reviewed:    EKG:  NSR  Recent Labs: 08/19/2022: ALT 21; Magnesium 1.9; TSH 0.772 08/21/2022: BUN 15; Creatinine, Ser 0.82; Hemoglobin 15.2; Platelets 185; Potassium 3.5; Sodium 137   Recent Lipid Panel Lab Results  Component Value Date/Time   CHOL 114 08/21/2022 04:34 AM   TRIG 129 08/21/2022 04:34 AM   HDL 33 (L) 08/21/2022 04:34 AM   LDLCALC 55 08/21/2022 04:34 AM    Physical Exam:    VS:  BP 136/86   Pulse 75   Ht 6' (1.829 m)   Wt 273 lb 3.2 oz (123.9 kg)   SpO2 97%   BMI 37.05 kg/m    No data found.   Wt Readings from Last 3 Encounters:  04/15/23 273 lb 3.2 oz (123.9 kg)  01/13/23 275 lb 3.2 oz (124.8 kg)  09/11/22 274 lb 3.2 oz (124.4 kg)    Constitutional: No acute distress Eyes: sclera non-icteric, normal conjunctiva and lids ENMT: normal dentition, moist mucous membranes Cardiovascular: regular rhythm, normal rate, no murmur. S1 and S2 normal. No jugular venous distention.  Respiratory: clear to auscultation bilaterally GI : normal bowel sounds, soft and nontender. No distention.   MSK: extremities warm, well perfused. No edema.  NEURO: grossly nonfocal exam, moves all extremities. PSYCH: alert and oriented x 3, normal mood and affect.     ASSESSMENT AND PLAN   1. Preoperative cardiovascular examination   2. ACS (acute coronary syndrome) (HCC)   3. Coronary artery disease involving native coronary artery of native heart without angina pectoris   4. Non-ST elevation (NSTEMI) myocardial infarction (HCC)   5. Hypertension, unspecified type   6. Hyperlipidemia LDL goal <70    Preoperative risk assessment -Plan for upcoming hip arthroplasty (R) with Dr. Despina Hick 07/02/2023. - would  recommend this be postponed until after September 2024 so that we can safely plan for discontinuation of DAPT and plan for aspirin monotherapy in the periprocedural timeframe.  The patient however is very keen to have this done due to severe right hip pain to the point where he feels he cannot ambulate.  I have counseled the patient extensively on risk of premature interruption of DAPT and in-stent thrombosis risk.  Patient is very reluctant to move his surgical date but I have instructed him to call today to see if a date past September 5 would be available for right hip arthroplasty. -The patient is intermediate risk for intermediate risk procedure.  No further cardiovascular testing is required prior to the procedure.  If this level of risk is acceptable to the patient and surgical team, the patient should be considered optimized from a cardiovascular standpoint.  My preference would be that the patient not hold his Plavix or aspirin prior to August 21, 2023 however patient feels that the July date for surgery is just shortly before his completion of DAPT and understands that he is taking on mildly increased risk of in-stent thrombosis by not completing 1 calendar year of DAPT.  He is willing to proceed and except this risk.  I have encouraged to discussion with his surgical team.   NSTEMI Coronary artery disease -NSTEMI September 2023 with PCI to LAD and OM1; residual left circumflex and RCA lesions - tx medically.  Normal EF. -With significant nosebleeding, Brilinta was switched to Plavix, he is doing well on this.  Continue aspirin 81 mg daily.  Avoid NSAIDs.  Discuss necessity of med compliance with DAPT.  Indication for DAPT is ACS, continue both aspirin and Plavix for 1 year, will determine monotherapy in September 2024. -With heart rates in the 40s, BB was stopped. -Continue lipid therapy with goal LDL less than 55.  Patient had LDL 55 at time of MI.  He is on Repatha per study. -Continue  aggressive diet and lifestyle modifications for secondary prevention of CAD.  Valvular heart disease -echo September 2023 with mild MR, mild-mod AI Repeat echo in September 2024.  Hypertension, well controlled -Patient states blood pressure was elevated  prior to admission. -Continue losartan 12.5 mg daily.  -Goal BP is <130/80.  As above, if changes in medication therapy pursued by primary care result in increased blood pressure, I would consider increasing his losartan to at least 25 mg daily to benefit antihypertensive BP goals.  Hyperlipidemia with goal LDL less than 55 -With LDL 55 at the of MI in 08/2022, patient enrolled in Evolve MI with addition of Evolocumab to lipid therapy.  No longer taking Crestor due to myalgias. -Continue pursue diet lifestyle modification.  Total time of encounter: 30 minutes total time of encounter, including 25 minutes spent in face-to-face patient care on the date of this encounter. This time includes coordination of care and counseling regarding above mentioned problem list. Remainder of non-face-to-face time involved reviewing chart documents/testing relevant to the patient encounter and documentation in the medical record. I have independently reviewed documentation from referring provider.   Weston Brass, MD, [facility]  [facility]     Medication Adjustments/Labs and Tests Ordered: Current medicines are reviewed at length with the patient today.  Concerns regarding medicines are outlined above.  Orders Placed This Encounter  Procedures   EKG 12-Lead   No orders of the defined types were placed in this encounter.   Patient Instructions     Follow-Up: At [facility], you and your health needs are our priority.  As part of our continuing mission to provide you with exceptional heart care, we have created designated Provider Care Teams.  These Care Teams include your primary Cardiologist (physician) and Advanced Practice  Providers (APPs -  Physician Assistants and Nurse Practitioners) who all work together to provide you with the care you need, when you need it.  We recommend signing up for the patient portal called "MyChart".  Sign up information is provided on this After Visit Summary.  MyChart is used to connect with patients for Virtual Visits (Telemedicine).  Patients are able to view lab/test results, encounter notes, upcoming appointments, etc.  Non-urgent messages can be sent to your provider as well.   To learn more about what you can do with MyChart, go to [email or web address].    Your next appointment:   5 month(s)  Provider:   Parke Poisson, MD

## 2023-04-15 ENCOUNTER — Ambulatory Visit: Payer: Medicare Other | Attending: Internal Medicine | Admitting: Internal Medicine

## 2023-04-15 ENCOUNTER — Encounter: Payer: Self-pay | Admitting: Internal Medicine

## 2023-04-15 VITALS — BP 136/86 | HR 75 | Ht 72.0 in | Wt 273.2 lb

## 2023-04-15 DIAGNOSIS — Z01810 Encounter for preprocedural cardiovascular examination: Secondary | ICD-10-CM

## 2023-04-15 DIAGNOSIS — I214 Non-ST elevation (NSTEMI) myocardial infarction: Secondary | ICD-10-CM

## 2023-04-15 DIAGNOSIS — I10 Essential (primary) hypertension: Secondary | ICD-10-CM

## 2023-04-15 DIAGNOSIS — E785 Hyperlipidemia, unspecified: Secondary | ICD-10-CM

## 2023-04-15 DIAGNOSIS — I2510 Atherosclerotic heart disease of native coronary artery without angina pectoris: Secondary | ICD-10-CM

## 2023-04-15 DIAGNOSIS — I249 Acute ischemic heart disease, unspecified: Secondary | ICD-10-CM | POA: Diagnosis not present

## 2023-04-15 NOTE — Patient Instructions (Signed)
    Follow-Up: At [facility], you and your health needs are our priority.  As part of our continuing mission to provide you with exceptional heart care, we have created designated Provider Care Teams.  These Care Teams include your primary Cardiologist (physician) and Advanced Practice Providers (APPs -  Physician Assistants and Nurse Practitioners) who all work together to provide you with the care you need, when you need it.  We recommend signing up for the patient portal called "MyChart".  Sign up information is provided on this After Visit Summary.  MyChart is used to connect with patients for Virtual Visits (Telemedicine).  Patients are able to view lab/test results, encounter notes, upcoming appointments, etc.  Non-urgent messages can be sent to your provider as well.   To learn more about what you can do with MyChart, go to [email or web address].    Your next appointment:   5 month(s)  Provider:   Parke Poisson, MD

## 2023-05-02 ENCOUNTER — Encounter: Payer: Self-pay | Admitting: *Deleted

## 2023-05-02 DIAGNOSIS — Z006 Encounter for examination for normal comparison and control in clinical research program: Secondary | ICD-10-CM

## 2023-05-02 NOTE — Research (Signed)
36 Week Follow-Up Visit Completed*   []  Not Necessary, No Potential Adverse Events Or Medication Issues Reported On Completed Subject Questionnaire   [x]  Yes, Contact With Subject/Alternate Contact Completed   []  Yes, No Contact With Subject/Alternate Contact Completed, But Electronic Health Record Was Reviewed   []  No, Unable To Contact Subject/Alternate Contact   Have you reviewed Ongoing medications on the Targeted Concomitant Medication form and updated the form as needed?   [x]  Yes   []  No   Subject Status*   [x]  Continuing In Follow-up   []  At Risk For Lost To Follow-up   []  Withdrawal From All Future Study Activities Including Passive Follow-up By Electronic Health Record Review Or Contact With Healthcare Provider Or Family Member/Friend   []  Death   Vital Status*   [x]  Alive   []  Deceased   []  Unknown   Last Known To Be Alive Source*   [x]  Subject Completed Follow-up Questionnaire/Seen In Person/Via Telephone Contact   []  Family Member or Caretaker   []  Primary Physician Or Medical Records   []  Publicly Available Source   []  Other  Date of last dose taken   09/May/2024  Over the last 12 weeks did the subject miss any doses? No   Over the last 12 weeks did the subject restart Evolocumab after an interruption? No

## 2023-06-11 ENCOUNTER — Other Ambulatory Visit (HOSPITAL_COMMUNITY): Payer: Self-pay

## 2023-06-16 ENCOUNTER — Encounter (HOSPITAL_COMMUNITY): Payer: Self-pay

## 2023-06-16 NOTE — Patient Instructions (Addendum)
SURGICAL WAITING ROOM VISITATION Patients having surgery or a procedure may have no more than 2 support people in the waiting area - these visitors may rotate.    Children under the age of 41 must have an adult with them who is not the patient.  If the patient needs to stay at the hospital during part of their recovery, the visitor guidelines for inpatient rooms apply. Pre-op nurse will coordinate an appropriate time for 1 support person to accompany patient in pre-op.  This support person may not rotate.    Please refer to the [facility] website for the visitor guidelines for Inpatients (after your surgery is over and you are in a regular room).       Your procedure is scheduled on: 07-02-23   Report to [facility] Main Entrance    Report to admitting at 7:40 AM   Call this number if you have problems the morning of surgery [phone]   Do not eat food :After Midnight.   After Midnight you may have the following liquids until 7:00 AM DAY OF SURGERY  Water Non-Citrus Juices (without pulp, NO RED-Apple, White grape, White cranberry) Black Coffee (NO MILK/CREAM OR CREAMERS, sugar ok)  Clear Tea (NO MILK/CREAM OR CREAMERS, sugar ok) regular and decaf                             Plain Jell-O (NO RED)                                           Fruit ices (not with fruit pulp, NO RED)                                     Popsicles (NO RED)                                                               Sports drinks like Gatorade (NO RED)                    The day of surgery:  Drink ONE (1) Pre-Surgery G2 at 7:00 AM the morning of surgery. Drink in one sitting. Do not sip.  This drink was given to you during your hospital  pre-op appointment visit. Nothing else to drink after completing the Pre-Surgery G2.          If you have questions, please contact your surgeon's office.   FOLLOW ANY ADDITIONAL PRE OP INSTRUCTIONS YOU RECEIVED FROM YOUR SURGEON'S OFFICE!!!      Oral Hygiene is also important to reduce your risk of infection.                                    Remember - BRUSH YOUR TEETH THE MORNING OF SURGERY WITH YOUR REGULAR TOOTHPASTE   Do NOT smoke after Midnight   Take these medicines the morning of surgery with A SIP OF WATER:   Duloxetine  Tylenol if needed  DO  NOT TAKE ANY ORAL DIABETIC MEDICATIONS DAY OF YOUR SURGERY (do not take Metformin)  Bring CPAP mask and tubing day of surgery.                              You may not have any metal on your body including jewelry, and body piercing             Do not wear lotions, powders, cologne, or deodorant              Men may shave face and neck.   Do not bring valuables to the hospital. [facility] IS NOT RESPONSIBLE   FOR VALUABLES.   Contacts, dentures or bridgework may not be worn into surgery.   Bring small overnight bag day of surgery.   DO NOT BRING YOUR HOME MEDICATIONS TO THE HOSPITAL. PHARMACY WILL DISPENSE MEDICATIONS LISTED ON YOUR MEDICATION LIST TO YOU DURING YOUR ADMISSION IN THE HOSPITAL!    Patients discharged on the day of surgery will not be allowed to drive home.  Someone NEEDS to stay with you for the first 24 hours after anesthesia.   Special Instructions: Bring a copy of your healthcare power of attorney and living will documents the day of surgery if you haven't scanned them before.              Please read over the following fact sheets you were given: IF YOU HAVE QUESTIONS ABOUT YOUR PRE-OP INSTRUCTIONS PLEASE CALL [phone]  If you received a COVID test during your pre-op visit  it is requested that you wear a mask when out in public, stay away from anyone that may not be feeling well and notify your surgeon if you develop symptoms. If you test positive for Covid or have been in contact with anyone that has tested positive in the last 10 days please notify you surgeon.    Pre-operative 5 CHG Bath Instructions   You can play a key role in reducing  the risk of infection after surgery. Your skin needs to be as free of germs as possible. You can reduce the number of germs on your skin by washing with CHG (chlorhexidine gluconate) soap before surgery. CHG is an antiseptic soap that kills germs and continues to kill germs even after washing.   DO NOT use if you have an allergy to chlorhexidine/CHG or antibacterial soaps. If your skin becomes reddened or irritated, stop using the CHG and notify one of our RNs at  [phone] .   Please shower with the CHG soap starting 4 days before surgery using the following schedule:     Please keep in mind the following:  DO NOT shave, including legs and underarms, starting the day of your first shower.   You may shave your face at any point before/day of surgery.  Place clean sheets on your bed the day you start using CHG soap. Use a clean washcloth (not used since being washed) for each shower. DO NOT sleep with pets once you start using the CHG.   CHG Shower Instructions:  If you choose to wash your hair and private area, wash first with your normal shampoo/soap.  After you use shampoo/soap, rinse your hair and body thoroughly to remove shampoo/soap residue.  Turn the water OFF and apply about 3 tablespoons (45 ml) of CHG soap to a CLEAN washcloth.  Apply CHG soap ONLY FROM YOUR NECK DOWN TO YOUR TOES (washing  for 3-5 minutes)  DO NOT use CHG soap on face, private areas, open wounds, or sores.  Pay special attention to the area where your surgery is being performed.  If you are having back surgery, having someone wash your back for you may be helpful. Wait 2 minutes after CHG soap is applied, then you may rinse off the CHG soap.  Pat dry with a clean towel  Put on clean clothes/pajamas   If you choose to wear lotion, please use ONLY the CHG-compatible lotions on the back of this paper.     Additional instructions for the day of surgery: DO NOT APPLY any lotions, deodorants, cologne, or perfumes.    Put on clean/comfortable clothes.  Brush your teeth.  Ask your nurse before applying any prescription medications to the skin.      CHG Compatible Lotions   Aveeno Moisturizing lotion  Cetaphil Moisturizing Cream  Cetaphil Moisturizing Lotion  Clairol Herbal Essence Moisturizing Lotion, Dry Skin  Clairol Herbal Essence Moisturizing Lotion, Extra Dry Skin  Clairol Herbal Essence Moisturizing Lotion, Normal Skin  Curel Age Defying Therapeutic Moisturizing Lotion with Alpha Hydroxy  Curel Extreme Care Body Lotion  Curel Soothing Hands Moisturizing Hand Lotion  Curel Therapeutic Moisturizing Cream, Fragrance-Free  Curel Therapeutic Moisturizing Lotion, Fragrance-Free  Curel Therapeutic Moisturizing Lotion, Original Formula  Eucerin Daily Replenishing Lotion  Eucerin Dry Skin Therapy Plus Alpha Hydroxy Crme  Eucerin Dry Skin Therapy Plus Alpha Hydroxy Lotion  Eucerin Original Crme  Eucerin Original Lotion  Eucerin Plus Crme Eucerin Plus Lotion  Eucerin TriLipid Replenishing Lotion  Keri Anti-Bacterial Hand Lotion  Keri Deep Conditioning Original Lotion Dry Skin Formula Softly Scented  Keri Deep Conditioning Original Lotion, Fragrance Free Sensitive Skin Formula  Keri Lotion Fast Absorbing Fragrance Free Sensitive Skin Formula  Keri Lotion Fast Absorbing Softly Scented Dry Skin Formula  Keri Original Lotion  Keri Skin Renewal Lotion Keri Silky Smooth Lotion  Keri Silky Smooth Sensitive Skin Lotion  Nivea Body Creamy Conditioning Oil  Nivea Body Extra Enriched Lotion  Nivea Body Original Lotion  Nivea Body Sheer Moisturizing Lotion Nivea Crme  Nivea Skin Firming Lotion  NutraDerm 30 Skin Lotion  NutraDerm Skin Lotion  NutraDerm Therapeutic Skin Cream  NutraDerm Therapeutic Skin Lotion  ProShield Protective Hand Cream  Provon moisturizing lotion   PATIENT SIGNATURE_________________________________  NURSE  SIGNATURE__________________________________  ________________________________________________________________________    Joshua Gomez  An incentive spirometer is a tool that can help keep your lungs clear and active. This tool measures how well you are filling your lungs with each breath. Taking long deep breaths may help reverse or decrease the chance of developing breathing (pulmonary) problems (especially infection) following: A long period of time when you are unable to move or be active. BEFORE THE PROCEDURE  If the spirometer includes an indicator to show your best effort, your nurse or respiratory therapist will set it to a desired goal. If possible, sit up straight or lean slightly forward. Try not to slouch. Hold the incentive spirometer in an upright position. INSTRUCTIONS FOR USE  Sit on the edge of your bed if possible, or sit up as far as you can in bed or on a chair. Hold the incentive spirometer in an upright position. Breathe out normally. Place the mouthpiece in your mouth and seal your lips tightly around it. Breathe in slowly and as deeply as possible, raising the piston or the ball toward the top of the column. Hold your breath for 3-5 seconds or for as long  as possible. Allow the piston or ball to fall to the bottom of the column. Remove the mouthpiece from your mouth and breathe out normally. Rest for a few seconds and repeat Steps 1 through 7 at least 10 times every 1-2 hours when you are awake. Take your time and take a few normal breaths between deep breaths. The spirometer may include an indicator to show your best effort. Use the indicator as a goal to work toward during each repetition. After each set of 10 deep breaths, practice coughing to be sure your lungs are clear. If you have an incision (the cut made at the time of surgery), support your incision when coughing by placing a pillow or rolled up towels firmly against it. Once you are able to get out of  bed, walk around indoors and cough well. You may stop using the incentive spirometer when instructed by your caregiver.  RISKS AND COMPLICATIONS Take your time so you do not get dizzy or light-headed. If you are in pain, you may need to take or ask for pain medication before doing incentive spirometry. It is harder to take a deep breath if you are having pain. AFTER USE Rest and breathe slowly and easily. It can be helpful to keep track of a log of your progress. Your caregiver can provide you with a simple table to help with this. If you are using the spirometer at home, follow these instructions: SEEK MEDICAL CARE IF:  You are having difficultly using the spirometer. You have trouble using the spirometer as often as instructed. Your pain medication is not giving enough relief while using the spirometer. You develop fever of 100.5 F (38.1 C) or higher. SEEK IMMEDIATE MEDICAL CARE IF:  You cough up bloody sputum that had not been present before. You develop fever of 102 F (38.9 C) or greater. You develop worsening pain at or near the incision site. MAKE SURE YOU:  Understand these instructions. Will watch your condition. Will get help right away if you are not doing well or get worse. Document Released: 04/14/2007 Document Revised: 02/24/2012 Document Reviewed: 06/15/2007 Exit[employer] Patient Information 2014 [employer], [place].   ________________________________________________________________________ WHAT IS A BLOOD TRANSFUSION? Blood Transfusion Information  A transfusion is the replacement of blood or some of its parts. Blood is made up of multiple cells which provide different functions. Red blood cells carry oxygen and are used for blood loss replacement. White blood cells fight against infection. Platelets control bleeding. Plasma helps clot blood. Other blood products are available for specialized needs, such as hemophilia or other clotting disorders. BEFORE THE TRANSFUSION   Who gives blood for transfusions?  Healthy volunteers who are fully evaluated to make sure their blood is safe. This is blood bank blood. Transfusion therapy is the safest it has ever been in the practice of medicine. Before blood is taken from a donor, a complete history is taken to make sure that person has no history of diseases nor engages in risky social behavior (examples are intravenous drug use or sexual activity with multiple partners). The donor's travel history is screened to minimize risk of transmitting infections, such as malaria. The donated blood is tested for signs of infectious diseases, such as HIV and hepatitis. The blood is then tested to be sure it is compatible with you in order to minimize the chance of a transfusion reaction. If you or a relative donates blood, this is often done in anticipation of surgery and is not appropriate for emergency situations.  It takes many days to process the donated blood. RISKS AND COMPLICATIONS Although transfusion therapy is very safe and saves many lives, the main dangers of transfusion include:  Getting an infectious disease. Developing a transfusion reaction. This is an allergic reaction to something in the blood you were given. Every precaution is taken to prevent this. The decision to have a blood transfusion has been considered carefully by your caregiver before blood is given. Blood is not given unless the benefits outweigh the risks. AFTER THE TRANSFUSION Right after receiving a blood transfusion, you will usually feel much better and more energetic. This is especially true if your red blood cells have gotten low (anemic). The transfusion raises the level of the red blood cells which carry oxygen, and this usually causes an energy increase. The nurse administering the transfusion will monitor you carefully for complications. HOME CARE INSTRUCTIONS  No special instructions are needed after a transfusion. You may find your energy is  better. Speak with your caregiver about any limitations on activity for underlying diseases you may have. SEEK MEDICAL CARE IF:  Your condition is not improving after your transfusion. You develop redness or irritation at the intravenous (IV) site. SEEK IMMEDIATE MEDICAL CARE IF:  Any of the following symptoms occur over the next 12 hours: Shaking chills. You have a temperature by mouth above 102 F (38.9 C), not controlled by medicine. Chest, back, or muscle pain. People around you feel you are not acting correctly or are confused. Shortness of breath or difficulty breathing. Dizziness and fainting. You get a rash or develop hives. You have a decrease in urine output. Your urine turns a dark color or changes to pink, red, or brown. Any of the following symptoms occur over the next 10 days: You have a temperature by mouth above 102 F (38.9 C), not controlled by medicine. Shortness of breath. Weakness after normal activity. The white part of the eye turns yellow (jaundice). You have a decrease in the amount of urine or are urinating less often. Your urine turns a dark color or changes to pink, red, or brown. Document Released: 11/29/2000 Document Revised: 02/24/2012 Document Reviewed: 07/18/2008 [facility] Patient Information 2014 [place], [place].  _______________________________________________________________________

## 2023-06-17 NOTE — H&P (Signed)
TOTAL HIP ADMISSION H&P  Patient is admitted for right total hip arthroplasty.  Subjective:  Chief Complaint: Right hip pain  HPI: Joshua Gomez, 68 y.o. male, has a history of pain and functional disability in the right hip due to arthritis and patient has failed non-surgical conservative treatments for greater than 12 weeks to include NSAID's and/or analgesics, corticosteriod injections, and activity modification. Onset of symptoms was gradual, starting  several  years ago with rapidlly worsening course since that time. The patient noted no past surgery on the right hip. Patient currently rates pain in the right hip at 9 out of 10 with activity. Patient has night pain, worsening of pain with activity and weight bearing, trendelenberg gait, and pain with passive range of motion. 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 This condition presents safety issues increasing the risk of falls. There is no current active infection.  Patient Active Problem List   Diagnosis Date Noted   CAD (coronary artery disease), native coronary artery 09/10/2022   Non-ST elevation (NSTEMI) myocardial infarction (HCC) 08/19/2022   HTN (hypertension) 08/19/2022   Hyperlipidemia LDL goal <70 08/19/2022   ACS (acute coronary syndrome) (HCC) 08/19/2022   BPH (benign prostatic hypertrophy) with urinary obstruction 07/24/2015    Past Medical History:  Diagnosis Date   Arthritis    BPH (benign prostatic hypertrophy)    Coronary artery disease    Feeling of incomplete bladder emptying    History of kidney stones    History of MRSA infection    2005--  left lower leg abscess w/ MRSA   Hyperlipidemia    Idiopathic peripheral neuropathy    Lower urinary tract symptoms (LUTS)    Myocardial infarction (HCC)    Wears glasses    Wears hearing aid    BILATERAL    Past  Surgical History:  Procedure Laterality Date   CORONARY STENT INTERVENTION N/A 08/20/2022   Procedure: CORONARY STENT INTERVENTION;  Surgeon: Tonny Bollman, MD;  Location: [facility] INVASIVE CV LAB;  Service: Cardiovascular;  Laterality: N/A;   CYSTO/  BILATERAL RETROGRADE PYELOGRAM  12-02-2005   LEFT HEART CATH AND CORONARY ANGIOGRAPHY N/A 08/20/2022   Procedure: LEFT HEART CATH AND CORONARY ANGIOGRAPHY;  Surgeon: Tonny Bollman, MD;  Location: [facility] INVASIVE CV LAB;  Service: Cardiovascular;  Laterality: N/A;   TRANSURETHRAL RESECTION OF PROSTATE N/A 07/24/2015   Procedure: TRANSURETHRAL RESECTION OF THE PROSTATE (TURP) WITH GYRUS;  Surgeon: Ihor Gully, MD;  Location: [facility] ;  Service: Urology;  Laterality: N/A;    Prior to Admission medications   Medication Sig Start Date End Date Taking? Authorizing Provider  acetaminophen (TYLENOL) 325 MG tablet Take 2 tablets (650 mg total) by mouth every 4 (four) hours as needed for mild pain or moderate pain. 08/21/22  Yes Parke Poisson, MD  ascorbic acid (VITAMIN C) 500 MG tablet Take 500 mg by mouth daily.   Yes [provider]  aspirin EC 81 MG tablet Take 81 mg by mouth daily.   Yes [provider]  clopidogrel (PLAVIX) 75 MG tablet Take 1 tablet (75 mg total) by mouth daily. 09/11/22  Yes Cannon Kettle, PA-C  DULoxetine (CYMBALTA) 20 MG capsule Take 20 mg by mouth daily. 06/28/22  Yes [provider]  fluticasone (FLONASE) 50 MCG/ACT nasal spray Place 2 sprays into both nostrils daily as needed for allergies. 06/16/15  Yes  [provider]  losartan (COZAAR) 50 MG tablet Take 50 mg by mouth daily. 03/19/23 03/18/24 Yes [provider]  Multiple Vitamin (MULTIVITAMIN ADULT PO) Take 1 tablet by mouth daily.   Yes [provider]  REPATHA SURECLICK 140 MG/ML SOAJ Inject 140 mg into the skin every 14 (fourteen) days for 12 doses. 02/04/23 07/09/23 Yes Herby Abraham, MD  Study - EVOLVE-MI  - evolocumab (REPATHA) 140 mg/mL SQ injection (PI-Stuckey) Inject 1 mL (140 mg total) into the skin every 14 (fourteen) days. For Investigational Use Only. Inject subcutaneously into abdomen, thigh, or upper arm every 14 days. Rotate injection sites and do not inject into areas where skin is tender, bruised, or red. Please contact [facility] for any questions or concerns regarding this medication. Patient not taking: Reported on 06/16/2023 08/21/22   Arty Baumgartner, NP    No Known Allergies  Social History   Socioeconomic History   Marital status: Married    Spouse name: Not on file   Number of children: Not on file   Years of education: Not on file   Highest education level: Not on file  Occupational History   Not on file  Tobacco Use   Smoking status: Never   Smokeless tobacco: Never  Substance and Sexual Activity   Alcohol use: No   Drug use: No   Sexual activity: Not on file  Other Topics Concern   Not on file  Social History Narrative   Not on file   Social Determinants of Health   Financial Resource Strain: Not on file  Food Insecurity: Not on file  Transportation Needs: Not on file  Physical Activity: Not on file  Stress: Not on file  Social Connections: Not on file  Intimate Partner Violence: Not on file    Tobacco Use: Low Risk  (06/16/2023)   Patient History    Smoking Tobacco Use: Never    Smokeless Tobacco Use: Never    Passive Exposure: Not on file   Social History   Substance and Sexual Activity  Alcohol Use No    No family history on file.  Review of Systems  Constitutional:  Negative for chills and fever.  HENT:  Negative for congestion, sore throat and tinnitus.   Eyes:  Negative for double vision, photophobia and pain.  Respiratory:  Negative for cough, shortness of breath and wheezing.   Cardiovascular:  Negative for chest pain, palpitations and orthopnea.  Gastrointestinal:  Negative for heartburn, nausea and vomiting.   Genitourinary:  Negative for dysuria, frequency and urgency.  Musculoskeletal:  Positive for joint pain.  Neurological:  Negative for dizziness, weakness and headaches.     Objective:  Physical Exam: Well nourished and well developed.  General: Alert and oriented x3, cooperative and pleasant, no acute distress.  Head: normocephalic, atraumatic, neck supple.  Eyes: EOMI.  Musculoskeletal:  Right hip Flexion 120. Rotation in 10 out 30. Abduction 30. He does have some discomfort on flexion, adduction, and internal rotation. There is a slight trochanteric tenderness also.  Calves soft and nontender. Motor function intact in LE. Strength 5/5 LE bilaterally. Neuro: Distal pulses 2+. Sensation to light touch intact in LE.  Imaging Review Plain radiographs demonstrate severe degenerative joint disease of the right hip. The bone quality appears to be adequate for age and reported activity level.  Assessment/Plan:  End stage arthritis, right hip  The patient history, physical examination, clinical judgement of the provider and imaging studies are consistent with end  stage degenerative joint disease of the right hip and total hip arthroplasty is deemed medically necessary. The treatment options including medical management, injection therapy, arthroscopy and arthroplasty were discussed at length. The risks and benefits of total hip arthroplasty were presented and reviewed. The risks due to aseptic loosening, infection, stiffness, dislocation/subluxation, thromboembolic complications and other imponderables were discussed. The patient acknowledged the explanation, agreed to proceed with the plan and consent was signed. Patient is being admitted for inpatient treatment for surgery, pain control, PT, OT, prophylactic antibiotics, VTE prophylaxis, progressive ambulation and ADLs and discharge planning.The patient is planning to be discharged  home .   Patient's anticipated LOS is less than 2  midnights, meeting these requirements: - Lives within 1 hour of care - Has a competent adult at home to recover with post-op recover - NO history of  - Chronic pain requiring opioids  - Diabetes  - Heart failure  - Stroke  - DVT/VTE  - Cardiac arrhythmia  - Respiratory Failure/COPD  - Renal failure  - Anemia  - Advanced Liver disease  Therapy Plans: HEP Disposition: Home with wife Planned DVT Prophylaxis: Plavix + ASA DME Needed: None PCP: Brett Fairy, MD (clearance received) Cardiologist: Weston Brass, MD (see below) TXA: IV Allergies: NKDA Anesthesia Concerns: None BMI: 37.3 Last HgbA1c: Not diabetic.  Pain Regimen: Hydrocodone Pharmacy: [facility])  Other: - Hx CAD with 2 stents placed 08/20/2022 - currently on Plavix & ASA 81 QD  *Cardiology note (Dr. Jacques Navy) 04/15/2023: "Preoperative risk assessment: -Plan for upcoming hip arthroplasty (R) with Dr. Despina Hick 07/02/2023. - would recommend this be postponed until after September 2024 so that we can safely plan for discontinuation of DAPT and plan for aspirin monotherapy in the periprocedural timeframe. The patient however is very keen to have this done due to severe right hip pain to the point where he feels he cannot ambulate. I have counseled the patient extensively on risk of premature interruption of DAPT and in-stent thrombosis risk. Patient is very reluctant to move his surgical date but I have instructed him to call today to see if a date past September 5 would be available for right hip arthroplasty. -The patient is intermediate risk for intermediate risk procedure. No further cardiovascular testing is required prior to the procedure. If this level of risk is acceptable to the patient and surgical team, the patient should be considered optimized from a cardiovascular standpoint. My preference would be that the patient not hold his Plavix or aspirin prior to August 21, 2023 however patient feels that the  July date for surgery is just shortly before his completion of DAPT and understands that he is taking on mildly increased risk of in-stent thrombosis by not completing 1 calendar year of DAPT. He is willing to proceed and except this risk. I have encouraged to discussion with his surgical team."  - Patient was instructed on what medications to stop prior to surgery. - Follow-up visit in 2 weeks with Dr. Lequita Halt - Begin physical therapy following surgery - Pre-operative lab work as pre-surgical testing - Prescriptions will be provided in hospital at time of discharge  Arther Abbott, PA-C Orthopedic Surgery [facility] [place]

## 2023-06-20 ENCOUNTER — Telehealth: Payer: Self-pay | Admitting: *Deleted

## 2023-06-20 NOTE — Telephone Encounter (Signed)
Pt has appt 09/15/23 with Dr. Jacques Navy. See notes from pre op APP. I will update all parties involved.

## 2023-06-20 NOTE — Telephone Encounter (Signed)
   Name: Joshua Gomez  DOB: 12-21-1955  MRN: [id number]  Primary Cardiologist: Parke Poisson, MD  Chart reviewed as part of pre-operative protocol coverage. Because of Joshua Gomez past medical history and time since last visit, he will require a follow-up in-office visit in order to better assess preoperative cardiovascular risk.  Patient is scheduled with Dr. Jacques Navy on 09/15/2023. I have updated the visit notes to reflect the need for preoperative evaluation.   Pre-op covering staff:  - Please contact requesting surgeon's office via preferred method (i.e, phone, fax) to inform them of need for appointment prior to surgery.   Joshua Levering, Joshua Gomez  06/20/2023, 1:06 PM

## 2023-06-20 NOTE — Telephone Encounter (Signed)
   Pre-operative Risk Assessment    Patient Name: Joshua Gomez  DOB: 1956/11/03 MRN: [id number]      Request for Surgical Clearance    Procedure:   TOTAL HIP ARTHOPLASTY   Date of Surgery:  Clearance 09/22/23                                 Surgeon:  Dr. Ollen Gross Surgeon's Group or Practice Name:  [facility]  Phone number:  [phone] Fax number:  [phone] ATTN  KELLY HANCOCK   Type of Clearance Requested:   - Pharmacy:  Hold Aspirin and Clopidogrel (Plavix) HOW LONG TO HOLD    Type of Anesthesia:   CHOICE   Additional requests/questions:  Please advise surgeon/provider what medications should be held.  Freddy Jaksch   06/20/2023, 10:55 AM

## 2023-06-23 ENCOUNTER — Encounter (HOSPITAL_COMMUNITY)
Admission: RE | Admit: 2023-06-23 | Discharge: 2023-06-23 | Disposition: A | Discharge status: Home / Self Care | Payer: Medicare Other | Source: Ambulatory Visit

## 2023-06-23 DIAGNOSIS — I2510 Atherosclerotic heart disease of native coronary artery without angina pectoris: Secondary | ICD-10-CM

## 2023-06-23 DIAGNOSIS — Z01818 Encounter for other preprocedural examination: Secondary | ICD-10-CM

## 2023-06-23 HISTORY — DX: Atherosclerotic heart disease of native coronary artery without angina pectoris: I25.10

## 2023-06-23 HISTORY — DX: Acute myocardial infarction, unspecified: I21.9

## 2023-06-23 HISTORY — DX: Hyperlipidemia, unspecified: E78.5

## 2023-07-02 ENCOUNTER — Ambulatory Visit: Admit: 2023-07-02 | Payer: Medicare Other | Admitting: Orthopedic Surgery

## 2023-07-02 SURGERY — ARTHROPLASTY, HIP, TOTAL, ANTERIOR APPROACH
Anesthesia: Choice | Site: Hip | Laterality: Right

## 2023-07-16 ENCOUNTER — Other Ambulatory Visit (HOSPITAL_COMMUNITY): Payer: Self-pay

## 2023-07-16 ENCOUNTER — Other Ambulatory Visit: Payer: Self-pay

## 2023-07-16 MED ORDER — REPATHA SURECLICK 140 MG/ML ~~LOC~~ SOAJ
140.0000 mg | SUBCUTANEOUS | 2 refills | Status: DC
  Filled 2023-07-16 – 2023-11-04 (×2): qty 12, 168d supply, fill #0
  Filled 2024-05-03: qty 12, 168d supply, fill #1

## 2023-07-17 ENCOUNTER — Other Ambulatory Visit: Payer: Self-pay

## 2023-08-28 ENCOUNTER — Other Ambulatory Visit: Payer: Self-pay | Admitting: Physician Assistant

## 2023-09-02 NOTE — Progress Notes (Signed)
Surgery orders requested via Epic inbox.

## 2023-09-06 ENCOUNTER — Encounter (HOSPITAL_COMMUNITY): Payer: Self-pay

## 2023-09-07 NOTE — Patient Instructions (Signed)
SURGICAL WAITING ROOM VISITATION Patients having surgery or a procedure may have no more than 2 support people in the waiting area - these visitors may rotate in the visitor waiting room.   Due to an increase in RSV and influenza rates and associated hospitalizations, children ages 57 and under may not visit patients in [facility] hospitals. If the patient needs to stay at the hospital during part of their recovery, the visitor guidelines for inpatient rooms apply.  PRE-OP VISITATION  Pre-op nurse will coordinate an appropriate time for 1 support person to accompany the patient in pre-op.  This support person may not rotate.  This visitor will be contacted when the time is appropriate for the visitor to come back in the pre-op area.  Please refer to the [facility] website for the visitor guidelines for Inpatients (after your surgery is over and you are in a regular room).  You are not required to quarantine at this time prior to your surgery. However, you must do this: Hand Hygiene often Do NOT share personal items Notify your provider if you are in close contact with someone who has COVID or you develop fever 100.4 or greater, new onset of sneezing, cough, sore throat, shortness of breath or body aches.  If you test positive for Covid or have been in contact with anyone that has tested positive in the last 10 days please notify you surgeon.    Your procedure is scheduled on:  MONDAY   September 22, 2023  Report to [facility] Main Entrance: [facility] entrance where the [employer] is available.   Report to admitting at:  10:30   AM  Call this number if you have any questions or problems the morning of surgery [phone]  Do not eat food after Midnight the night prior to your surgery/procedure.  After Midnight you may have the following liquids until   1300 AM DAY OF SURGERY  Clear Liquid Diet Water Black Coffee (sugar ok, NO MILK/CREAM OR CREAMERS)  Tea (sugar ok, NO  MILK/CREAM OR CREAMERS) regular and decaf                             Plain Jell-O  with no fruit (NO RED)                                           Fruit ices (not with fruit pulp, NO RED)                                     Popsicles (NO RED)                                                                  Juice: NO CITRUS JUICES: only apple, WHITE grape, WHITE cranberry Sports drinks like Gatorade or Powerade (NO RED)                   The day of surgery:  Drink ONE (1) Pre-Surgery G2 at  10:00 AM the morning of surgery.  Drink in one sitting. Do not sip.  This drink was given to you during your hospital pre-op appointment visit. Nothing else to drink after completing the Pre-Surgery G2 : No candy, chewing gum or throat lozenges.    FOLLOW ANY ADDITIONAL PRE OP INSTRUCTIONS YOU RECEIVED FROM YOUR SURGEON'S OFFICE!!!   Oral Hygiene is also important to reduce your risk of infection.        Remember - BRUSH YOUR TEETH THE MORNING OF SURGERY WITH YOUR REGULAR TOOTHPASTE  Do NOT smoke after Midnight the night before surgery.  STOP TAKING all Vitamins, Herbs and supplements 1 week before your surgery.   Take ONLY these medicines the morning of surgery with A SIP OF WATER: Duloxetine (Cymbalta)   You may take Tylenol if needed for pain.  You may use your Flonase nasal spray if needed.                   You may not have any metal on your body including jewelry, and body piercing  Do not wear lotions, powders, cologne, or deodorant  Men may shave face and neck.  Contacts, Hearing Aids, dentures or bridgework may not be worn into surgery. DENTURES WILL BE REMOVED PRIOR TO SURGERY PLEASE DO NOT APPLY "Poly grip" OR ADHESIVES!!!  You may bring a small overnight bag with you on the day of surgery, only pack items that are not valuable. [facility] IS NOT RESPONSIBLE   FOR VALUABLES THAT ARE LOST OR STOLEN.   Do not bring your home medications to the hospital. The Pharmacy will dispense  medications listed on your medication list to you during your admission in the Hospital.  Special Instructions: Bring a copy of your healthcare power of attorney and living will documents the day of surgery, if you wish to have them scanned into your [facility] Records- EPIC  Please read over the following fact sheets you were given: IF YOU HAVE QUESTIONS ABOUT YOUR PRE-OP INSTRUCTIONS, PLEASE CALL [phone].     Pre-operative 5 CHG Bath Instructions   You can play a key role in reducing the risk of infection after surgery. Your skin needs to be as free of germs as possible. You can reduce the number of germs on your skin by washing with CHG (chlorhexidine gluconate) soap before surgery. CHG is an antiseptic soap that kills germs and continues to kill germs even after washing.   DO NOT use if you have an allergy to chlorhexidine/CHG or antibacterial soaps. If your skin becomes reddened or irritated, stop using the CHG and notify one of our RNs at [phone]  Please shower with the CHG soap starting 4 days before surgery using the following schedule: START SHOWERS ON   THURSDAY  September 18, 2023  Please keep in mind the following:  DO NOT shave, including legs and underarms, starting the day of your first shower.   You may shave your face at any point before/day of surgery.   Place clean sheets on your bed the day you start using CHG soap. Use a clean washcloth (not used since being washed) for each shower. DO NOT sleep with pets once you start using the CHG.   CHG Shower Instructions:  If you choose to wash your hair and private area, wash first with your normal shampoo/soap.  After you use shampoo/soap, rinse your hair and body thoroughly to remove shampoo/soap residue.  Turn the water OFF and  apply about 3 tablespoons (45 ml) of CHG soap to a CLEAN washcloth.  Apply CHG soap ONLY FROM YOUR NECK DOWN TO YOUR TOES (washing for 3-5 minutes)  DO NOT use CHG soap on face, private areas, open wounds, or sores.  Pay special attention to the area where your surgery is being performed.  If you are having back surgery, having someone wash your back for you may be helpful.  Wait 2 minutes after CHG soap is applied, then you may rinse off the CHG soap.  Pat dry with a clean towel  Put on clean clothes/pajamas   If you choose to wear lotion, please use ONLY the CHG-compatible lotions on the back of this paper.     Additional instructions for the day of surgery: DO NOT APPLY any lotions, deodorants, cologne, or perfumes.   Put on clean/comfortable clothes.  Brush your teeth.  Ask your nurse before applying any prescription medications to the skin.      CHG Compatible Lotions   Aveeno Moisturizing lotion  Cetaphil Moisturizing Cream  Cetaphil Moisturizing Lotion  Clairol Herbal Essence Moisturizing Lotion, Dry Skin  Clairol Herbal Essence Moisturizing Lotion, Extra Dry Skin  Clairol Herbal Essence Moisturizing Lotion, Normal Skin  Curel Age Defying Therapeutic Moisturizing Lotion with Alpha Hydroxy  Curel Extreme Care Body Lotion  Curel Soothing Hands Moisturizing Hand Lotion  Curel Therapeutic Moisturizing Cream, Fragrance-Free  Curel Therapeutic Moisturizing Lotion, Fragrance-Free  Curel Therapeutic Moisturizing Lotion, Original Formula  Eucerin Daily Replenishing Lotion  Eucerin Dry Skin Therapy Plus Alpha Hydroxy Crme  Eucerin Dry Skin Therapy Plus Alpha Hydroxy Lotion  Eucerin Original Crme  Eucerin Original Lotion  Eucerin Plus Crme Eucerin Plus Lotion  Eucerin TriLipid Replenishing Lotion  Keri Anti-Bacterial Hand Lotion  Keri Deep Conditioning Original Lotion Dry Skin Formula Softly Scented  Keri Deep Conditioning Original Lotion, Fragrance Free Sensitive Skin  Formula  Keri Lotion Fast Absorbing Fragrance Free Sensitive Skin Formula  Keri Lotion Fast Absorbing Softly Scented Dry Skin Formula  Keri Original Lotion  Keri Skin Renewal Lotion Keri Silky Smooth Lotion  Keri Silky Smooth Sensitive Skin Lotion  Nivea Body Creamy Conditioning Oil  Nivea Body Extra Enriched Lotion  Nivea Body Original Lotion  Nivea Body Sheer Moisturizing Lotion Nivea Crme  Nivea Skin Firming Lotion  NutraDerm 30 Skin Lotion  NutraDerm Skin Lotion  NutraDerm Therapeutic Skin Cream  NutraDerm Therapeutic Skin Lotion  ProShield Protective Hand Cream  Provon moisturizing lotion   FAILURE TO FOLLOW THESE INSTRUCTIONS MAY RESULT IN THE CANCELLATION OF YOUR SURGERY  PATIENT SIGNATURE_________________________________  NURSE SIGNATURE__________________________________  ________________________________________________________________________       Joshua Gomez    An incentive spirometer is a tool that can help keep your lungs clear and active. This tool measures how well you are filling your lungs with each breath. Taking  long deep breaths may help reverse or decrease the chance of developing breathing (pulmonary) problems (especially infection) following: A long period of time when you are unable to move or be active. BEFORE THE PROCEDURE  If the spirometer includes an indicator to show your best effort, your nurse or respiratory therapist will set it to a desired goal. If possible, sit up straight or lean slightly forward. Try not to slouch. Hold the incentive spirometer in an upright position. INSTRUCTIONS FOR USE  Sit on the edge of your bed if possible, or sit up as far as you can in bed or on a chair. Hold the incentive spirometer in an upright position. Breathe out normally. Place the mouthpiece in your mouth and seal your lips tightly around it. Breathe in slowly and as deeply as possible, raising the piston or the ball toward the top of the  column. Hold your breath for 3-5 seconds or for as long as possible. Allow the piston or ball to fall to the bottom of the column. Remove the mouthpiece from your mouth and breathe out normally. Rest for a few seconds and repeat Steps 1 through 7 at least 10 times every 1-2 hours when you are awake. Take your time and take a few normal breaths between deep breaths. The spirometer may include an indicator to show your best effort. Use the indicator as a goal to work toward during each repetition. After each set of 10 deep breaths, practice coughing to be sure your lungs are clear. If you have an incision (the cut made at the time of surgery), support your incision when coughing by placing a pillow or rolled up towels firmly against it. Once you are able to get out of bed, walk around indoors and cough well. You may stop using the incentive spirometer when instructed by your caregiver.  RISKS AND COMPLICATIONS Take your time so you do not get dizzy or light-headed. If you are in pain, you may need to take or ask for pain medication before doing incentive spirometry. It is harder to take a deep breath if you are having pain. AFTER USE Rest and breathe slowly and easily. It can be helpful to keep track of a log of your progress. Your caregiver can provide you with a simple table to help with this. If you are using the spirometer at home, follow these instructions: SEEK MEDICAL CARE IF:  You are having difficultly using the spirometer. You have trouble using the spirometer as often as instructed. Your pain medication is not giving enough relief while using the spirometer. You develop fever of 100.5 F (38.1 C) or higher.                                                                                                    SEEK IMMEDIATE MEDICAL CARE IF:  You cough up bloody sputum that had not been present before. You develop fever of 102 F (38.9 C) or greater. You develop worsening pain at or near  the incision site. MAKE SURE YOU:  Understand these instructions. Will watch your condition.  Will get help right away if you are not doing well or get worse. Document Released: 04/14/2007 Document Revised: 02/24/2012 Document Reviewed: 06/15/2007 [facility] Patient Information 2014 [place], [place].     WHAT IS A BLOOD TRANSFUSION? Blood Transfusion Information  A transfusion is the replacement of blood or some of its parts. Blood is made up of multiple cells which provide different functions. Red blood cells carry oxygen and are used for blood loss replacement. White blood cells fight against infection. Platelets control bleeding. Plasma helps clot blood. Other blood products are available for specialized needs, such as hemophilia or other clotting disorders. BEFORE THE TRANSFUSION  Who gives blood for transfusions?  Healthy volunteers who are fully evaluated to make sure their blood is safe. This is blood bank blood. Transfusion therapy is the safest it has ever been in the practice of medicine. Before blood is taken from a donor, a complete history is taken to make sure that person has no history of diseases nor engages in risky social behavior (examples are intravenous drug use or sexual activity with multiple partners). The donor's travel history is screened to minimize risk of transmitting infections, such as malaria. The donated blood is tested for signs of infectious diseases, such as HIV and hepatitis. The blood is then tested to be sure it is compatible with you in order to minimize the chance of a transfusion reaction. If you or a relative donates blood, this is often done in anticipation of surgery and is not appropriate for emergency situations. It takes many days to process the donated blood. RISKS AND COMPLICATIONS Although transfusion therapy is very safe and saves many lives, the main dangers of transfusion include:  Getting an infectious disease. Developing a transfusion  reaction. This is an allergic reaction to something in the blood you were given. Every precaution is taken to prevent this. The decision to have a blood transfusion has been considered carefully by your caregiver before blood is given. Blood is not given unless the benefits outweigh the risks. AFTER THE TRANSFUSION Right after receiving a blood transfusion, you will usually feel much better and more energetic. This is especially true if your red blood cells have gotten low (anemic). The transfusion raises the level of the red blood cells which carry oxygen, and this usually causes an energy increase. The nurse administering the transfusion will monitor you carefully for complications. HOME CARE INSTRUCTIONS  No special instructions are needed after a transfusion. You may find your energy is better. Speak with your caregiver about any limitations on activity for underlying diseases you may have. SEEK MEDICAL CARE IF:  Your condition is not improving after your transfusion. You develop redness or irritation at the intravenous (IV) site. SEEK IMMEDIATE MEDICAL CARE IF:  Any of the following symptoms occur over the next 12 hours: Shaking chills. You have a temperature by mouth above 102 F (38.9 C), not controlled by medicine. Chest, back, or muscle pain. People around you feel you are not acting correctly or are confused. Shortness of breath or difficulty breathing. Dizziness and fainting. You get a rash or develop hives. You have a decrease in urine output. Your urine turns a dark color or changes to pink, red, or brown. Any of the following symptoms occur over the next 10 days: You have a temperature by mouth above 102 F (38.9 C), not controlled by medicine. Shortness of breath. Weakness after normal activity. The white part of the eye turns yellow (jaundice). You have a decrease in  the amount of urine or are urinating less often. Your urine turns a dark color or changes to pink, red, or  brown. Document Released: 11/29/2000 Document Revised: 02/24/2012 Document Reviewed: 07/18/2008 [facility] Patient Information 2014 [place], [place].  _______________________________________________________________________

## 2023-09-07 NOTE — Progress Notes (Signed)
COVID Vaccine received:  []  No [x]  Yes Date of any COVID positive Test in last 90 days:  PCP - Brett Fairy, MD  at [facility] [phone] (Work)  [phone] (Fax)  Cardiologist - Weston Brass, MD  has appt on 09-15-23 for clearance.   Chest x-ray - 08-19-2022  2v  Epic EKG -04-15-2023  Epic   Stress Test -  ECHO - 08-20-2022  Epic Cardiac Cath - 08-20-2022  LHC by Dr. Excell Seltzer  PCR screen: [x]  Ordered & Completed           []   No Order but Needs PROFEND           []   N/A for this surgery  Surgery Plan:  []  Ambulatory   [x]  Outpatient in bed  []  Admit  Anesthesia:    []  General  []  Spinal  [x]   Choice []   MAC  Pacemaker / ICD device []  No []  Yes   Spinal Cord Stimulator:[x]  No []  Yes       History of Sleep Apnea? [x]  No []  Yes   CPAP used?- [x]  No []  Yes    Does the patient monitor blood sugar?  []  No []  Yes  []  N/A  Patient has: []  NO Hx DM   [x]  Pre-DM   []  DM1  []   DM2 Last A1c was:  5.8  on 05-26-2023        Does patient have a [employer] or Dexacom? []  No []  Yes   Fasting Blood Sugar Ranges-  Checks Blood Sugar _____ times a day  GLP1 agonist / usual dose - Wegovy injection on Wednesdays-   GLP1 instructions: last injection will be on 09-10-23  Blood Thinner / Instructions: Plavix has appt with Dr. Jacques Navy on 09-15-23  Aspirin Instructions:  ASA 81 mg  ERAS Protocol Ordered: []  No  [x]  Yes PRE-SURGERY []  ENSURE  [x]  G2   []  No Drink Ordered Patient is to be NPO after: 10:00   Patient was given the 5 CHG shower / bath instructions for THA surgery along with 2 bottles of the CHG soap. Patient will start this on: THursday 09-18-2023   All questions were asked and answered, Patient voiced understanding of this process.   Activity level: Patient is able / unable to climb a flight of stairs without difficulty; []  No CP  []  No SOB, but would have ___   Patient can / can not perform ADLs without assistance.   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 I called [place] Chalmers at [employer] and she said that he could vary the day that he injects as much as 1 week prior to surgery.   Patient denies shortness of breath, fever, cough and chest pain at PAT appointment.  Patient verbalized understanding and agreement to the Pre-Surgical Instructions that were given to them at this PAT appointment. Patient was also educated of the need to review these PAT instructions again prior to his surgery.I reviewed the appropriate phone numbers to call if they have any and questions or concerns.

## 2023-09-08 ENCOUNTER — Other Ambulatory Visit: Payer: Self-pay

## 2023-09-08 ENCOUNTER — Encounter: Payer: Self-pay | Admitting: *Deleted

## 2023-09-08 DIAGNOSIS — Z006 Encounter for examination for normal comparison and control in clinical research program: Secondary | ICD-10-CM

## 2023-09-08 NOTE — Research (Signed)
48 week Follow-Up Visit Completed*   []  Not Necessary, No Potential Adverse Events Or Medication Issues Reported On Completed Subject Questionnaire   [x]  Yes, Contact With Subject/Alternate Contact Completed   []  Yes, No Contact With Subject/Alternate Contact Completed, But Electronic Health Record Was Reviewed   []  No, Unable To Contact Subject/Alternate Contact   Have you reviewed Ongoing medications on the Targeted Concomitant Medication form and updated the form as needed?   [x]  Yes   []  No   Subject Status*   [x]  Continuing In Follow-up   []  At Risk For Lost To Follow-up   []  Withdrawal From All Future Study Activities Including Passive Follow-up By Electronic Health Record Review Or Contact With Healthcare Provider Or Family Member/Friend   []  Death   Vital Status*   [x]  Alive   []  Deceased   []  Unknown   Last Known To Be Alive Source*   [x]  Subject Completed Follow-up Questionnaire/Seen In Person/Via Telephone Contact   []  Family Member or Caretaker   []  Primary Physician Or Medical Records   []  Publicly Available Source   []  Other  Date of last dose taken   07-Sep-2023  Over the last 12 weeks did the subject miss any doses?n/a  Over the last 12 weeks did the subject restart Evolocumab after an interruption?n/a

## 2023-09-09 ENCOUNTER — Other Ambulatory Visit: Payer: Self-pay

## 2023-09-09 ENCOUNTER — Encounter (HOSPITAL_COMMUNITY): Payer: Self-pay

## 2023-09-09 ENCOUNTER — Encounter (HOSPITAL_COMMUNITY)
Admission: RE | Admit: 2023-09-09 | Discharge: 2023-09-09 | Disposition: A | Discharge status: Home / Self Care | Payer: Medicare Other | Source: Ambulatory Visit | Attending: Orthopedic Surgery | Admitting: Orthopedic Surgery

## 2023-09-09 VITALS — BP 119/76 | HR 71 | Temp 97.9°F | Resp 18 | Ht 72.0 in | Wt 263.0 lb

## 2023-09-09 DIAGNOSIS — Z01812 Encounter for preprocedural laboratory examination: Secondary | ICD-10-CM | POA: Diagnosis present

## 2023-09-09 DIAGNOSIS — Z01818 Encounter for other preprocedural examination: Secondary | ICD-10-CM

## 2023-09-09 DIAGNOSIS — I2510 Atherosclerotic heart disease of native coronary artery without angina pectoris: Secondary | ICD-10-CM | POA: Diagnosis not present

## 2023-09-09 HISTORY — DX: Prediabetes: R73.03

## 2023-09-09 HISTORY — DX: Essential (primary) hypertension: I10

## 2023-09-09 LAB — BASIC METABOLIC PANEL
Anion gap: 10 (ref 5–15)
BUN: 19 mg/dL (ref 8–23)
CO2: 24 mmol/L (ref 22–32)
Calcium: 8.5 mg/dL — ABNORMAL LOW (ref 8.9–10.3)
Chloride: 103 mmol/L (ref 98–111)
Creatinine, Ser: 0.81 mg/dL (ref 0.61–1.24)
GFR, Estimated: >60 mL/min (ref >60)
Glucose, Bld: 108 mg/dL — ABNORMAL HIGH (ref 70–99)
Potassium: 4 mmol/L (ref 3.5–5.1)
Sodium: 137 mmol/L (ref 135–145)

## 2023-09-09 LAB — CBC
HCT: 50.9 % (ref 39.0–52.0)
Hemoglobin: 16.7 g/dL (ref 13.0–17.0)
MCH: 30.5 pg (ref 26.0–34.0)
MCHC: 32.8 g/dL (ref 30.0–36.0)
MCV: 92.9 fL (ref 80.0–100.0)
Platelets: 241 10*3/uL (ref 150–400)
RBC: 5.48 MIL/uL (ref 4.22–5.81)
RDW: 12.9 % (ref 11.5–15.5)
WBC: 7.9 10*3/uL (ref 4.0–10.5)
nRBC: 0 % (ref 0.0–0.2)

## 2023-09-09 LAB — SURGICAL PCR SCREEN
MRSA, PCR: NEGATIVE
Staphylococcus aureus: NEGATIVE

## 2023-09-09 LAB — TYPE AND SCREEN
ABO/RH(D): A NEG
Antibody Screen: NEGATIVE

## 2023-09-15 ENCOUNTER — Encounter: Payer: Self-pay | Admitting: Internal Medicine

## 2023-09-15 ENCOUNTER — Ambulatory Visit: Payer: Medicare Other | Attending: Internal Medicine | Admitting: Internal Medicine

## 2023-09-15 VITALS — BP 127/82 | HR 57 | Ht 72.0 in | Wt 269.0 lb

## 2023-09-15 DIAGNOSIS — I10 Essential (primary) hypertension: Secondary | ICD-10-CM

## 2023-09-15 DIAGNOSIS — Z01810 Encounter for preprocedural cardiovascular examination: Secondary | ICD-10-CM | POA: Diagnosis not present

## 2023-09-15 DIAGNOSIS — I2510 Atherosclerotic heart disease of native coronary artery without angina pectoris: Secondary | ICD-10-CM

## 2023-09-15 DIAGNOSIS — I214 Non-ST elevation (NSTEMI) myocardial infarction: Secondary | ICD-10-CM | POA: Diagnosis not present

## 2023-09-15 DIAGNOSIS — E785 Hyperlipidemia, unspecified: Secondary | ICD-10-CM

## 2023-09-15 NOTE — Progress Notes (Addendum)
Cardiology Office Note:  .   Date:  09/15/2023  ID:  Joshua Gomez, DOB December 01, 1956, MRN [id number] PCP: [facility] Providers Cardiologist:  Parke Poisson, MD    History of Present Illness: .   Joshua Gomez is a 67 y.o. male.  Discussed the use of AI scribe software for clinical note transcription with the patient, who gave verbal consent to proceed.  History of Present Illness   The patient, with a history of hypertension, hyperlipidemia, coronary artery disease, and a recent NSTEMI, presents for preoperative risk stratification for right hip arthroplasty. The patient had PCI to the LAD and first OM with residual moderate disease in the distal AV circumflex and non-dominant RCA. The patient has been on dual antiplatelet therapy (DAPT) with aspirin and Plavix for a year. The patient reports no major bleeding but does experience minor nosebleeds and bleeding from minor cuts and scrapes. The patient denies any chest pain or shortness of breath similar to the symptoms experienced during the NSTEMI. The patient is also on losartan for blood pressure control, which has been effective, with readings consistently around 120/80. The patient is also on Repatha as part of a trial for a PCSK9 inhibitor.        ROS: negative except per HPI above.  Studies Reviewed: Marland Kitchen   EKG Interpretation Date/Time:  Monday September 15 2023 07:58:16 EDT Ventricular Rate:  57 PR Interval:  206 QRS Duration:  98 QT Interval:  386 QTC Calculation: 375 R Axis:   16  Text Interpretation: Sinus bradycardia Confirmed by Weston Brass ([id number]) on 09/15/2023 8:11:26 AM    Results   DIAGNOSTIC Echocardiogram: Preserved EF, mild mitral valve regurgitation, mild to moderate aortic valve regurgitation (08/2022) EKG: Sinus bradycardia (09/15/2023) Cardiac catheterization: PCI to the LAD and first OM, moderate disease in distal AV circumflex and non-dominant  RCA (08/2022)     Risk Assessment/Calculations:             Physical Exam:   VS:  BP 127/82   Pulse (!) 57   Ht 6' (1.829 m)   Wt 269 lb (122 kg)   SpO2 99%   BMI 36.48 kg/m    Wt Readings from Last 3 Encounters:  09/15/23 269 lb (122 kg)  09/09/23 263 lb (119.3 kg)  04/15/23 273 lb 3.2 oz (123.9 kg)     Physical Exam   VITALS: BP- 127/82 CHEST: Lungs clear to auscultation. CARDIOVASCULAR: Heart sounds normal.     GEN: Well nourished, well developed in no acute distress NECK: No JVD; No carotid bruits CARDIAC: RRR, no murmurs, rubs, gallops RESPIRATORY:  Clear to auscultation without rales, wheezing or rhonchi  ABDOMEN: Soft, non-tender, non-distended EXTREMITIES:  No edema; No deformity   ASSESSMENT AND PLAN: .    1. Coronary artery disease involving native coronary artery of native heart without angina pectoris   2. Hypertension, unspecified type   3. Preoperative cardiovascular examination   4. Non-ST elevation (NSTEMI) myocardial infarction (HCC)   5. Hyperlipidemia LDL goal <70     Assessment and Plan    Coronary Artery Disease History of NSTEMI in September 2023 with PCI to LAD and first OM. Residual moderate disease in distal AV circumflex and non-dominant RCA managed medically. No recent chest pain or shortness of breath. EKG shows sinus bradycardia. -Continue current medical management. -Repeat echocardiogram prior to next visit to monitor mild mitral valve regurgitation and mild to moderate aortic valve regurgitation.  Dual Antiplatelet Therapy (DAPT) Completed one year of DAPT with aspirin and Plavix following PCI. No major bleeding events, minor nosebleeds reported. Preoperative risk stratification for upcoming right hip arthroplasty. -Discontinue Plavix today. -Continue aspirin 81mg  daily. -Reevaluate in three months for possible switch back to Plavix given residual disease.  Hypertension Well controlled on Losartan 50mg  daily. -Continue Losartan  50mg  daily.  Hyperlipidemia Enrolled in [employer] MI trial for PCSK9 inhibitor (Repatha). -Continue Repatha as part of trial. -Primary care provider to check cholesterol levels at upcoming physical.  Preoperative Risk Stratification Intermediate risk for upcoming right hip arthroplasty scheduled for next Monday.  -Continue aspirin 81mg  daily. -Plan for follow-up in three months post-surgery.     The patient is intermediate risk for intermediate risk procedure.  No further cardiovascular testing is required prior to the procedure. Risk is felt to be nonmodifiable at this time. If this level of risk is acceptable to the patient and surgical team, the patient should be considered optimized from a cardiovascular standpoint. Surgery scheduled, patient is willing to proceed.            Signed, Parke Poisson, MD

## 2023-09-15 NOTE — H&P (Signed)
TOTAL HIP ADMISSION H&P  Patient is admitted for right total hip arthroplasty.  Subjective:  Chief Complaint: Right hip pain  HPI: Joshua Gomez, 67 y.o. male, has a history of pain and functional disability in the right hip due to arthritis and patient has failed non-surgical conservative treatments for greater than 12 weeks to include use of assistive devices and activity modification. Onset of symptoms was gradual, starting 4 years ago with gradually worsening course since that time. The patient noted no past surgery on the right hip. Patient currently rates pain in the right hip at 8 out of 10 with activity. Patient has worsening of pain with activity and weight bearing, pain that interfers with activities of daily living, and pain with passive range of motion. Patient has evidence of subchondral cysts, periarticular osteophytes, and joint space narrowing by imaging studies. This condition presents safety issues increasing the risk of falls. There is no current active infection.  Patient Active Problem List   Diagnosis Date Noted   CAD (coronary artery disease), native coronary artery 09/10/2022   Non-ST elevation (NSTEMI) myocardial infarction (HCC) 08/19/2022   HTN (hypertension) 08/19/2022   Hyperlipidemia LDL goal <70 08/19/2022   ACS (acute coronary syndrome) (HCC) 08/19/2022   BPH (benign prostatic hypertrophy) with urinary obstruction 07/24/2015    Past Medical History:  Diagnosis Date   Arthritis    BPH (benign prostatic hypertrophy)    Coronary artery disease    Feeling of incomplete bladder emptying    History of kidney stones    History of MRSA infection    2005--  left lower leg abscess w/ MRSA   Hyperlipidemia    Hypertension    Idiopathic peripheral neuropathy    Lower urinary tract symptoms (LUTS)    Myocardial infarction (HCC)    Pre-diabetes    Wears glasses    Wears hearing aid    BILATERAL    Past Surgical History:  Procedure Laterality Date    COLONOSCOPY     CORONARY STENT INTERVENTION N/A 08/20/2022   Procedure: CORONARY STENT INTERVENTION;  Surgeon: Tonny Bollman, MD;  Location: [facility] INVASIVE CV LAB;  Service: Cardiovascular;  Laterality: N/A;   CYSTO/  BILATERAL RETROGRADE PYELOGRAM  12/02/2005   LEFT HEART CATH AND CORONARY ANGIOGRAPHY N/A 08/20/2022   Procedure: LEFT HEART CATH AND CORONARY ANGIOGRAPHY;  Surgeon: Tonny Bollman, MD;  Location: [facility] INVASIVE CV LAB;  Service: Cardiovascular;  Laterality: N/A;   TONSILLECTOMY     TRANSURETHRAL RESECTION OF PROSTATE N/A 07/24/2015   Procedure: TRANSURETHRAL RESECTION OF THE PROSTATE (TURP) WITH GYRUS;  Surgeon: Ihor Gully, MD;  Location: [facility] [place];  Service: Urology;  Laterality: N/A;    Prior to Admission medications   Medication Sig Start Date End Date Taking? Authorizing Provider  acetaminophen (TYLENOL) 325 MG tablet Take 2 tablets (650 mg total) by mouth every 4 (four) hours as needed for mild pain or moderate pain. 08/21/22  Yes Parke Poisson, MD  ascorbic acid (VITAMIN C) 500 MG tablet Take 500 mg by mouth daily.   Yes [provider]  aspirin EC 81 MG tablet Take 81 mg by mouth daily.   Yes [provider]  celecoxib (CELEBREX) 200 MG capsule Take 200 mg by mouth daily.   Yes [provider]  DULoxetine (CYMBALTA) 20 MG capsule Take 20 mg by mouth daily. 06/28/22  Yes [provider]  Evolocumab (REPATHA SURECLICK) 140 MG/ML SOAJ Inject 140 mg into the skin every 14 (fourteen) days. For  Investigational Use Only. Inject subcutaneously into abdomen, thigh, or upper arm every 14 days. Rotate injection sites and do not inject into areas where skin is tender, bruised, or red. Please contact [facility] for any questions or concerns regarding this medication. 07/16/23  Yes Herby Abraham, MD  fluticasone (FLONASE) 50 MCG/ACT nasal spray Place 2 sprays into both nostrils daily as needed for allergies. 06/16/15   Yes [provider]  losartan (COZAAR) 50 MG tablet Take 50 mg by mouth daily. 03/19/23 03/18/24 Yes [provider]  Misc Natural Products (COMPLETE PROSTATE HEALTH PO) Take 1 capsule by mouth daily. Super Beta Prostate   Yes [provider]  Multiple Vitamin (MULTIVITAMIN ADULT PO) Take 1 tablet by mouth daily.   Yes [provider]  vitamin E 180 MG (400 UNITS) capsule Take 400 Units by mouth daily.   Yes [provider]  WEGOVY 0.25 MG/0.5ML SOAJ Inject 0.5 mg into the skin once a week. Wednesdays 07/22/23  Yes [provider]    No Known Allergies  Social History   Socioeconomic History   Marital status: Married    Spouse name: Not on file   Number of children: Not on file   Years of education: Not on file   Highest education level: Not on file  Occupational History   Not on file  Tobacco Use   Smoking status: Never   Smokeless tobacco: Never  Vaping Use   Vaping status: Never Used  Substance and Sexual Activity   Alcohol use: No   Drug use: No   Sexual activity: Not Currently  Other Topics Concern   Not on file  Social History Narrative   Not on file   Social Determinants of Health   Financial Resource Strain: Not on file  Food Insecurity: Not on file  Transportation Needs: Not on file  Physical Activity: Not on file  Stress: Not on file  Social Connections: Not on file  Intimate Partner Violence: Not on file    Tobacco Use: Low Risk  (09/15/2023)   Patient History    Smoking Tobacco Use: Never    Smokeless Tobacco Use: Never    Passive Exposure: Not on file   Social History   Substance and Sexual Activity  Alcohol Use No    No family history on file.  ROS   Objective:  Physical Exam: Well nourished and well developed.  General: Alert and oriented x3, cooperative and pleasant, no acute distress.  Head: normocephalic, atraumatic, neck supple.  Eyes: EOMI.  Musculoskeletal:    Right hip Flexion  120. Rotation in 10 out 30. Abduction 30. He does have some discomfort on flexion, adduction, and internal rotation. There is a slight trochanteric tenderness also.    Calves soft and nontender. Motor function intact in LE. Strength 5/5 LE bilaterally.  Neuro: Distal pulses 2+. Sensation to light touch intact in LE.  Imaging:  The patient's MRI scan and plain radiographs were reviewed. He does have significant cam impingement morphology with a labral tear and with significant superolateral joint space narrowing just about bone-on-bone.  Assessment/Plan:  End stage arthritis, right hip  The patient history, physical examination, clinical judgement of the provider and imaging studies are consistent with end stage degenerative joint disease of the right hip and total hip arthroplasty is deemed medically necessary. The treatment options including medical management, injection therapy, arthroscopy and arthroplasty were discussed at length. The risks and benefits of total hip arthroplasty were presented and reviewed.  The risks due to aseptic loosening, infection, stiffness, dislocation/subluxation, thromboembolic complications and other imponderables were discussed. The patient acknowledged the explanation, agreed to proceed with the plan and consent was signed. Patient is being admitted for inpatient treatment for surgery, pain control, PT, OT, prophylactic antibiotics, VTE prophylaxis, progressive ambulation and ADLs and discharge planning.The patient is planning to be discharged  home .   Patient's anticipated LOS is less than 2 midnights, meeting these requirements: - Younger than 74 - Lives within 1 hour of care - Has a competent adult at home to recover with post-op recover - NO history of  - Chronic pain requiring opiods  - Diabetes  - Heart failure  - Stroke  - DVT/VTE  - Cardiac arrhythmia  - Respiratory Failure/COPD  - Renal failure  - Anemia  - Advanced Liver disease   Therapy  Plans: HEP Disposition: Home with wife Planned DVT Prophylaxis: ASA 81mg  BID DME Needed: None PCP: Brett Fairy, MD (clearance received) Cardiologist: Weston Brass, MD (clearance in 9/30 Epic note) TXA: IV Allergies: NKDA Anesthesia Concerns: None BMI: 37.3 Last HgbA1c: Not diabetic.  Pain Regimen: Hydrocodone Pharmacy: [facility])  Other: -Stop Wegovy 1 week before surgery - Hx CAD with 2 stents placed 08/20/2022. Plavix discontinued per cardiology on 09/15/23. -Has tolerated tramadol with duloxetine in the past  - Patient was instructed on what medications to stop prior to surgery. - Follow-up visit in 2 weeks with Dr. Lequita Halt - Begin physical therapy following surgery - Pre-operative lab work as pre-surgical testing - Prescriptions will be provided in hospital at time of discharge  Weston Brass, PA-C Orthopedic Surgery [facility] [place]

## 2023-09-15 NOTE — Progress Notes (Signed)
DISCUSSION: Joshua Gomez is a 67 yo male who presents to PAT prior to right THA. PMH of HTN, CAD, hx of NSTEMI s/p PCI to LAD and first OM (08/20/2022), pre-diabetes.  Patient has followed up with Cardiology on 09/15/23. He has completed 1 year of DAPT. No cardiac symptoms. BP has been controlled. On Repatha. Repeat echo to be completed to monitor mild MR and mild AR in the next couple months and advised to f/u in 3 months. Per Dr. Jacques Navy:   "Preoperative Risk Stratification Intermediate risk for upcoming right hip arthroplasty scheduled for next Monday.  -Continue aspirin 81mg  daily. -Plan for follow-up in three months post-surgery."    VS: BP 119/76 Comment: right arm sitting  Pulse 71   Temp 36.6 C (Oral)   Resp 18   Ht 6' (1.829 m)   Wt 119.3 kg   SpO2 97%   BMI 35.67 kg/m   PROVIDERS: [facility] Cardiology:   LABS: Labs reviewed: Acceptable for surgery. (all labs ordered are listed, but only abnormal results are displayed)  Labs Reviewed  BASIC METABOLIC PANEL - Abnormal; Notable for the following components:      Result Value   Glucose, Bld 108 (*)    Calcium 8.5 (*)    All other components within normal limits  SURGICAL PCR SCREEN  CBC  TYPE AND SCREEN     IMAGES:   EKG 09/15/23:  Sinus bradycardia, rate 57   CV:  Left heart cath 08/20/2022:    Prox LAD to Mid LAD lesion is 95% stenosed.   Dist Cx lesion is 70% stenosed.   1st Mrg lesion is 100% stenosed.   Prox RCA lesion is 70% stenosed.   Ost LM to Mid LM lesion is 30% stenosed.   A drug-eluting stent was successfully placed using a STENT ONYX FRONTIER 3.0X38.   A drug-eluting stent was successfully placed using a STENT ONYX FRONTIER 3.0X38.   Post intervention, there is a 0% residual stenosis.   Post intervention, there is a 0% residual stenosis.   1.  Patent left main with mild nonobstructive disease 2.  Severe proximal to mid LAD stenosis, treated with a 3.0  x 38 mm Onyx frontier DES, reducing 95% stenosis to 0% post procedure 3.  Total occlusion of the first OM branch of the circumflex, treated with PCI using a 3.0 x 38 mm Onyx frontier DES, reducing 100% stenosis to 0% post procedure 4.  Moderate residual disease in the distal AV circumflex (dominant vessel) and nondominant RCA, both lesions appropriate for medical therapy   Recommendations: Medical therapy for residual CAD.  Aggressive secondary risk reduction measures.  Dual antiplatelet therapy with aspirin and ticagrelor at least 12 months.  Echo 08/20/2022:  IMPRESSIONS     1. Left ventricular ejection fraction, by estimation, is 60 to 65%. The  left ventricle has normal function. The left ventricle has no regional  wall motion abnormalities. Left ventricular diastolic parameters were  normal.   2. Right ventricular systolic function is normal. The right ventricular  size is normal.   3. The mitral valve is normal in structure. Mild mitral valve  regurgitation. No evidence of mitral stenosis.   4. The aortic valve is tricuspid. There is mild calcification of the  aortic valve. There is mild thickening of the aortic valve. Aortic valve  regurgitation is mild to moderate. Aortic valve sclerosis/calcification is  present, without any evidence of  aortic stenosis.   5. The inferior vena cava  is normal in size with greater than 50%  respiratory variability, suggesting right atrial pressure of 3 mmHg.   Past Medical History:  Diagnosis Date   Arthritis    BPH (benign prostatic hypertrophy)    Coronary artery disease    Feeling of incomplete bladder emptying    History of kidney stones    History of MRSA infection    2005--  left lower leg abscess w/ MRSA   Hyperlipidemia    Hypertension    Idiopathic peripheral neuropathy    Lower urinary tract symptoms (LUTS)    Myocardial infarction (HCC)    Pre-diabetes    Wears glasses    Wears hearing aid    BILATERAL    Past Surgical  History:  Procedure Laterality Date   COLONOSCOPY     CORONARY STENT INTERVENTION N/A 08/20/2022   Procedure: CORONARY STENT INTERVENTION;  Surgeon: Tonny Bollman, MD;  Location: [facility] INVASIVE CV LAB;  Service: Cardiovascular;  Laterality: N/A;   CYSTO/  BILATERAL RETROGRADE PYELOGRAM  12/02/2005   LEFT HEART CATH AND CORONARY ANGIOGRAPHY N/A 08/20/2022   Procedure: LEFT HEART CATH AND CORONARY ANGIOGRAPHY;  Surgeon: Tonny Bollman, MD;  Location: [facility] INVASIVE CV LAB;  Service: Cardiovascular;  Laterality: N/A;   TONSILLECTOMY     TRANSURETHRAL RESECTION OF PROSTATE N/A 07/24/2015   Procedure: TRANSURETHRAL RESECTION OF THE PROSTATE (TURP) WITH GYRUS;  Surgeon: Ihor Gully, MD;  Location: [facility] [place];  Service: Urology;  Laterality: N/A;    MEDICATIONS:  acetaminophen (TYLENOL) 325 MG tablet   ascorbic acid (VITAMIN C) 500 MG tablet   aspirin EC 81 MG tablet   celecoxib (CELEBREX) 200 MG capsule   DULoxetine (CYMBALTA) 20 MG capsule   Evolocumab (REPATHA SURECLICK) 140 MG/ML SOAJ   fluticasone (FLONASE) 50 MCG/ACT nasal spray   losartan (COZAAR) 50 MG tablet   Misc Natural Products (COMPLETE PROSTATE HEALTH PO)   Multiple Vitamin (MULTIVITAMIN ADULT PO)   vitamin E 180 MG (400 UNITS) capsule   WEGOVY 0.25 MG/0.5ML SOAJ   No current facility-administered medications for this encounter.   Marcille Blanco [facility] Surgical Short Stay/Anesthesiology [facility] Phone [phone] 09/15/2023 11:49 AM

## 2023-09-15 NOTE — Patient Instructions (Signed)
Medication Instructions:  Your physician has recommended you make the following change in your medication:   -Stop taking clopidogrel (plavix).  -Continue taking aspirin 81mg  once daily.  *If you need a refill on your cardiac medications before your next appointment, please call your pharmacy*   Testing/Procedures: Your physician has requested that you have an echocardiogram. Echocardiography is a painless test that uses sound waves to create images of your heart. It provides your doctor with information about the size and shape of your heart and how well your heart's chambers and valves are working. This procedure takes approximately one hour. There are no restrictions for this procedure. Please do NOT wear cologne, perfume, aftershave, or lotions (deodorant is allowed). Please arrive 15 minutes prior to your appointment time. This will take place at [address] **To do in Dec/Jan**    Follow-Up: At [facility], you and your health needs are our priority.  As part of our continuing mission to provide you with exceptional heart care, we have created designated Provider Care Teams.  These Care Teams include your primary Cardiologist (physician) and Advanced Practice Providers (APPs -  Physician Assistants and Nurse Practitioners) who all work together to provide you with the care you need, when you need it.  We recommend signing up for the patient portal called "MyChart".  Sign up information is provided on this After Visit Summary.  MyChart is used to connect with patients for Virtual Visits (Telemedicine).  Patients are able to view lab/test results, encounter notes, upcoming appointments, etc.  Non-urgent messages can be sent to your provider as well.   To learn more about what you can do with MyChart, go to [email or web address].    Your next appointment:   3 month(s)  Provider:   Parke Poisson, MD

## 2023-09-15 NOTE — Anesthesia Preprocedure Evaluation (Addendum)
Anesthesia Evaluation  Patient identified by MRN, date of birth, ID band Patient awake    Reviewed: Allergy & Precautions, NPO status , Patient's Chart, lab work & pertinent test results, reviewed documented beta blocker date and time   Airway Mallampati: II  TM Distance: >3 FB Neck ROM: Full    Dental no notable dental hx. (+) Dental Advisory Given, Teeth Intact, Caps   Pulmonary neg pulmonary ROS   Pulmonary exam normal breath sounds clear to auscultation       Cardiovascular hypertension, Pt. on medications + CAD and + Past MI  Normal cardiovascular exam Rhythm:Regular Rate:Normal  NSTEMI 08/19/2022 PCI to the LAD & OM1 with residual moderate disease in the distal AV circumflex and non-dominant RCA  EKG 09/15/23 SB 57/min  Cardiac Cath 08/20/22   Prox LAD to Mid LAD lesion is 95% stenosed.   Dist Cx lesion is 70% stenosed.   1st Mrg lesion is 100% stenosed.   Prox RCA lesion is 70% stenosed.   Ost LM to Mid LM lesion is 30% stenosed.   A drug-eluting stent was successfully placed using a STENT ONYX FRONTIER 3.0X38.   A drug-eluting stent was successfully placed using a STENT ONYX FRONTIER 3.0X38.   Post intervention, there is a 0% residual stenosis.   Post intervention, there is a 0% residual stenosis.   1.  Patent left main with mild nonobstructive disease 2.  Severe proximal to mid LAD stenosis, treated with a 3.0 x 38 mm Onyx frontier DES, reducing 95% stenosis to 0% post procedure 3.  Total occlusion of the first OM branch of the circumflex, treated with PCI using a 3.0 x 38 mm Onyx frontier DES, reducing 100% stenosis to 0% post procedure 4.  Moderate residual disease in the distal AV circumflex (dominant vessel) and nondominant RCA, both lesions appropriate for medical therapy  Echo 08/20/22 1. Left ventricular ejection fraction, by estimation, is 60 to 65%. The  left ventricle has normal function. The left  ventricle has no regional  wall motion abnormalities. Left ventricular diastolic parameters were  normal.   2. Right ventricular systolic function is normal. The right ventricular  size is normal.   3. The mitral valve is normal in structure. Mild mitral valve  regurgitation. No evidence of mitral stenosis.   4. The aortic valve is tricuspid. There is mild calcification of the  aortic valve. There is mild thickening of the aortic valve. Aortic valve  regurgitation is mild to moderate. Aortic valve sclerosis/calcification is  present, without any evidence of aortic stenosis.   5. The inferior vena cava is normal in size with greater than 50%        Neuro/Psych Peripheral neuropathy  Neuromuscular disease  negative psych ROS   GI/Hepatic negative GI ROS, Neg liver ROS,,,  Endo/Other  Obesity Hyperlipidemia Pre Diabetes GLP-1 RA therapy- last dose > 1 week  Renal/GU negative Renal ROS   BPH    Musculoskeletal  (+) Arthritis , Osteoarthritis,  Right hip OA   Abdominal  (+) + obese  Peds  Hematology Plavix therapy- last dose 1 week ago   Anesthesia Other Findings   Reproductive/Obstetrics                              Anesthesia Physical Anesthesia Plan  ASA: 3  Anesthesia Plan: Spinal   Post-op Pain Management:    Induction: Intravenous  PONV Risk Score and Plan: 2 and Treatment  may vary due to age or medical condition and Propofol infusion  Airway Management Planned: Natural Airway and Simple Face Mask  Additional Equipment:   Intra-op Plan:   Post-operative Plan:   Informed Consent: I have reviewed the patients History and Physical, chart, labs and discussed the procedure including the risks, benefits and alternatives for the proposed anesthesia with the patient or authorized representative who has indicated his/her understanding and acceptance.     Dental advisory given  Plan Discussed with: Anesthesiologist and  CRNA  Anesthesia Plan Comments: (See PAT note from 9/24 by Sherlie Ban PA-C )         Anesthesia Quick Evaluation

## 2023-09-20 ENCOUNTER — Other Ambulatory Visit: Payer: Self-pay | Admitting: Internal Medicine

## 2023-09-22 ENCOUNTER — Encounter (HOSPITAL_COMMUNITY): Payer: Self-pay | Admitting: Orthopedic Surgery

## 2023-09-22 ENCOUNTER — Observation Stay (HOSPITAL_COMMUNITY)
Admission: RE | Admit: 2023-09-22 | Discharge: 2023-09-23 | Disposition: A | Discharge status: Home / Self Care | Payer: Medicare Other | Source: Ambulatory Visit | Attending: Orthopedic Surgery | Admitting: Orthopedic Surgery

## 2023-09-22 ENCOUNTER — Observation Stay (HOSPITAL_COMMUNITY): Payer: Medicare Other

## 2023-09-22 ENCOUNTER — Encounter (HOSPITAL_COMMUNITY)
Admission: RE | Disposition: A | Discharge status: Home / Self Care | Payer: Self-pay | Source: Ambulatory Visit | Attending: Orthopedic Surgery

## 2023-09-22 ENCOUNTER — Ambulatory Visit (HOSPITAL_BASED_OUTPATIENT_CLINIC_OR_DEPARTMENT_OTHER): Payer: Medicare Other | Admitting: Registered Nurse

## 2023-09-22 ENCOUNTER — Other Ambulatory Visit: Payer: Self-pay

## 2023-09-22 ENCOUNTER — Ambulatory Visit (HOSPITAL_COMMUNITY): Payer: Medicare Other | Admitting: Medical

## 2023-09-22 ENCOUNTER — Ambulatory Visit (HOSPITAL_COMMUNITY): Payer: Medicare Other

## 2023-09-22 DIAGNOSIS — Z955 Presence of coronary angioplasty implant and graft: Secondary | ICD-10-CM | POA: Insufficient documentation

## 2023-09-22 DIAGNOSIS — Z79899 Other long term (current) drug therapy: Secondary | ICD-10-CM | POA: Diagnosis not present

## 2023-09-22 DIAGNOSIS — Z7982 Long term (current) use of aspirin: Secondary | ICD-10-CM | POA: Insufficient documentation

## 2023-09-22 DIAGNOSIS — I2510 Atherosclerotic heart disease of native coronary artery without angina pectoris: Secondary | ICD-10-CM | POA: Insufficient documentation

## 2023-09-22 DIAGNOSIS — I10 Essential (primary) hypertension: Secondary | ICD-10-CM | POA: Insufficient documentation

## 2023-09-22 DIAGNOSIS — M1611 Unilateral primary osteoarthritis, right hip: Principal | ICD-10-CM | POA: Insufficient documentation

## 2023-09-22 DIAGNOSIS — M179 Osteoarthritis of knee, unspecified: Principal | ICD-10-CM | POA: Diagnosis present

## 2023-09-22 HISTORY — PX: TOTAL HIP ARTHROPLASTY: SHX124

## 2023-09-22 LAB — ABO/RH: ABO/RH(D): A NEG

## 2023-09-22 SURGERY — ARTHROPLASTY, HIP, TOTAL, ANTERIOR APPROACH
Anesthesia: Spinal | Site: Hip | Laterality: Right

## 2023-09-22 MED ORDER — ACETAMINOPHEN 325 MG PO TABS: 325.0000 mg | ORAL_TABLET | Freq: Four times a day (QID) | ORAL | Status: DC | PRN

## 2023-09-22 MED ORDER — METHOCARBAMOL 500 MG IVPB - SIMPLE MED: 500.0000 mg | Freq: Four times a day (QID) | INTRAVENOUS | Status: DC | PRN

## 2023-09-22 MED ORDER — ONDANSETRON HCL 4 MG/2ML IJ SOLN: 4.0000 mg | Freq: Four times a day (QID) | INTRAMUSCULAR | Status: DC | PRN

## 2023-09-22 MED ORDER — ONDANSETRON HCL 4 MG/2ML IJ SOLN
INTRAMUSCULAR | Status: DC | PRN
  Administered 2023-09-22: 4 mg via INTRAVENOUS

## 2023-09-22 MED ORDER — FENTANYL CITRATE (PF) 100 MCG/2ML IJ SOLN
INTRAMUSCULAR | Status: AC
  Filled 2023-09-22: qty 2

## 2023-09-22 MED ORDER — ASPIRIN 81 MG PO CHEW
81.0000 mg | CHEWABLE_TABLET | Freq: Two times a day (BID) | ORAL | Status: DC
  Administered 2023-09-23: 81 mg via ORAL
  Filled 2023-09-22: qty 1

## 2023-09-22 MED ORDER — SODIUM CHLORIDE 0.9 % IV SOLN: INTRAVENOUS | Status: DC

## 2023-09-22 MED ORDER — BUPIVACAINE-EPINEPHRINE (PF) 0.25% -1:200000 IJ SOLN
INTRAMUSCULAR | Status: DC | PRN
  Administered 2023-09-22: 30 mL

## 2023-09-22 MED ORDER — METOCLOPRAMIDE HCL 5 MG/ML IJ SOLN: 5.0000 mg | Freq: Three times a day (TID) | INTRAMUSCULAR | Status: DC | PRN

## 2023-09-22 MED ORDER — TRAMADOL HCL 50 MG PO TABS
50.0000 mg | ORAL_TABLET | Freq: Four times a day (QID) | ORAL | Status: DC | PRN
  Administered 2023-09-22: 50 mg via ORAL
  Filled 2023-09-22: qty 1

## 2023-09-22 MED ORDER — 0.9 % SODIUM CHLORIDE (POUR BTL) OPTIME
TOPICAL | Status: DC | PRN
  Administered 2023-09-22: 1000 mL

## 2023-09-22 MED ORDER — POLYETHYLENE GLYCOL 3350 17 G PO PACK: 17.0000 g | PACK | Freq: Every day | ORAL | Status: DC | PRN

## 2023-09-22 MED ORDER — PROPOFOL 500 MG/50ML IV EMUL
INTRAVENOUS | Status: DC | PRN
  Administered 2023-09-22: 40 ug/kg/min via INTRAVENOUS

## 2023-09-22 MED ORDER — LACTATED RINGERS IV SOLN: INTRAVENOUS | Status: DC

## 2023-09-22 MED ORDER — MAGNESIUM CITRATE PO SOLN: 1.0000 | Freq: Once | ORAL | Status: DC | PRN

## 2023-09-22 MED ORDER — LOSARTAN POTASSIUM 50 MG PO TABS
50.0000 mg | ORAL_TABLET | Freq: Every day | ORAL | Status: DC
  Administered 2023-09-23: 50 mg via ORAL
  Filled 2023-09-22: qty 1

## 2023-09-22 MED ORDER — POVIDONE-IODINE 10 % EX SWAB: 2.0000 | Freq: Once | CUTANEOUS | Status: DC

## 2023-09-22 MED ORDER — WATER FOR IRRIGATION, STERILE IR SOLN
Status: DC | PRN
  Administered 2023-09-22: 1000 mL

## 2023-09-22 MED ORDER — METHOCARBAMOL 500 MG PO TABS
500.0000 mg | ORAL_TABLET | Freq: Four times a day (QID) | ORAL | Status: DC | PRN
  Administered 2023-09-22 – 2023-09-23 (×2): 500 mg via ORAL
  Filled 2023-09-22 (×2): qty 1

## 2023-09-22 MED ORDER — TRANEXAMIC ACID-NACL 1000-0.7 MG/100ML-% IV SOLN
1000.0000 mg | INTRAVENOUS | Status: AC
  Administered 2023-09-22: 1000 mg via INTRAVENOUS
  Filled 2023-09-22: qty 100

## 2023-09-22 MED ORDER — ONDANSETRON HCL 4 MG PO TABS: 4.0000 mg | ORAL_TABLET | Freq: Four times a day (QID) | ORAL | Status: DC | PRN

## 2023-09-22 MED ORDER — PHENYLEPHRINE 80 MCG/ML (10ML) SYRINGE FOR IV PUSH (FOR BLOOD PRESSURE SUPPORT)
PREFILLED_SYRINGE | INTRAVENOUS | Status: AC
  Filled 2023-09-22: qty 10

## 2023-09-22 MED ORDER — FENTANYL CITRATE (PF) 100 MCG/2ML IJ SOLN
INTRAMUSCULAR | Status: DC | PRN
  Administered 2023-09-22: 50 ug via INTRAVENOUS

## 2023-09-22 MED ORDER — ORAL CARE MOUTH RINSE: 15.0000 mL | Freq: Once | OROMUCOSAL | Status: AC

## 2023-09-22 MED ORDER — DOCUSATE SODIUM 100 MG PO CAPS
100.0000 mg | ORAL_CAPSULE | Freq: Two times a day (BID) | ORAL | Status: DC
  Administered 2023-09-22 – 2023-09-23 (×2): 100 mg via ORAL
  Filled 2023-09-22 (×2): qty 1

## 2023-09-22 MED ORDER — DEXAMETHASONE SODIUM PHOSPHATE 10 MG/ML IJ SOLN
INTRAMUSCULAR | Status: AC
  Filled 2023-09-22: qty 1

## 2023-09-22 MED ORDER — MORPHINE SULFATE (PF) 2 MG/ML IV SOLN
0.5000 mg | INTRAVENOUS | Status: DC | PRN
  Administered 2023-09-22: 1 mg via INTRAVENOUS
  Filled 2023-09-22: qty 1

## 2023-09-22 MED ORDER — ACETAMINOPHEN 10 MG/ML IV SOLN
1000.0000 mg | Freq: Four times a day (QID) | INTRAVENOUS | Status: DC
  Administered 2023-09-22: 1000 mg via INTRAVENOUS
  Filled 2023-09-22: qty 100

## 2023-09-22 MED ORDER — DEXAMETHASONE SODIUM PHOSPHATE 10 MG/ML IJ SOLN
10.0000 mg | Freq: Once | INTRAMUSCULAR | Status: AC
  Administered 2023-09-23: 10 mg via INTRAVENOUS
  Filled 2023-09-22: qty 1

## 2023-09-22 MED ORDER — ONDANSETRON HCL 4 MG/2ML IJ SOLN
INTRAMUSCULAR | Status: AC
  Filled 2023-09-22: qty 2

## 2023-09-22 MED ORDER — PHENYLEPHRINE HCL-NACL 20-0.9 MG/250ML-% IV SOLN
INTRAVENOUS | Status: DC | PRN
Start: 2023-09-22 — End: 2023-09-22
  Administered 2023-09-22: 25 ug/min via INTRAVENOUS

## 2023-09-22 MED ORDER — PHENYLEPHRINE 80 MCG/ML (10ML) SYRINGE FOR IV PUSH (FOR BLOOD PRESSURE SUPPORT)
PREFILLED_SYRINGE | INTRAVENOUS | Status: DC | PRN
  Administered 2023-09-22 (×2): 80 ug via INTRAVENOUS

## 2023-09-22 MED ORDER — CEFAZOLIN IN SODIUM CHLORIDE 3-0.9 GM/100ML-% IV SOLN
3.0000 g | INTRAVENOUS | Status: AC
  Administered 2023-09-22: 3 g via INTRAVENOUS
  Filled 2023-09-22: qty 100

## 2023-09-22 MED ORDER — BISACODYL 10 MG RE SUPP: 10.0000 mg | Freq: Every day | RECTAL | Status: DC | PRN

## 2023-09-22 MED ORDER — PHENOL 1.4 % MT LIQD: 1.0000 | OROMUCOSAL | Status: DC | PRN

## 2023-09-22 MED ORDER — OXYCODONE HCL 5 MG PO TABS: 5.0000 mg | ORAL_TABLET | Freq: Once | ORAL | Status: DC | PRN

## 2023-09-22 MED ORDER — MENTHOL 3 MG MT LOZG: 1.0000 | LOZENGE | OROMUCOSAL | Status: DC | PRN

## 2023-09-22 MED ORDER — METOCLOPRAMIDE HCL 5 MG PO TABS: 5.0000 mg | ORAL_TABLET | Freq: Three times a day (TID) | ORAL | Status: DC | PRN

## 2023-09-22 MED ORDER — MIDAZOLAM HCL 5 MG/5ML IJ SOLN
INTRAMUSCULAR | Status: DC | PRN
  Administered 2023-09-22: 2 mg via INTRAVENOUS

## 2023-09-22 MED ORDER — HYDROMORPHONE HCL 1 MG/ML IJ SOLN: 0.2500 mg | INTRAMUSCULAR | Status: DC | PRN

## 2023-09-22 MED ORDER — BUPIVACAINE IN DEXTROSE 0.75-8.25 % IT SOLN
INTRATHECAL | Status: DC | PRN
  Administered 2023-09-22: 2 mL via INTRATHECAL

## 2023-09-22 MED ORDER — OXYCODONE HCL 5 MG/5ML PO SOLN: 5.0000 mg | Freq: Once | ORAL | Status: DC | PRN

## 2023-09-22 MED ORDER — HYDROCODONE-ACETAMINOPHEN 5-325 MG PO TABS
1.0000 | ORAL_TABLET | ORAL | Status: DC | PRN
  Administered 2023-09-22: 1 via ORAL
  Administered 2023-09-23 (×2): 2 via ORAL
  Administered 2023-09-23: 1 via ORAL
  Filled 2023-09-22: qty 2
  Filled 2023-09-22 (×2): qty 1
  Filled 2023-09-22: qty 2

## 2023-09-22 MED ORDER — BUPIVACAINE-EPINEPHRINE 0.25% -1:200000 IJ SOLN
INTRAMUSCULAR | Status: AC
  Filled 2023-09-22: qty 1

## 2023-09-22 MED ORDER — CHLORHEXIDINE GLUCONATE 0.12 % MT SOLN
15.0000 mL | Freq: Once | OROMUCOSAL | Status: AC
  Administered 2023-09-22: 15 mL via OROMUCOSAL

## 2023-09-22 MED ORDER — CEFAZOLIN SODIUM-DEXTROSE 2-4 GM/100ML-% IV SOLN
2.0000 g | Freq: Four times a day (QID) | INTRAVENOUS | Status: AC
  Administered 2023-09-22 (×2): 2 g via INTRAVENOUS
  Filled 2023-09-22 (×2): qty 100

## 2023-09-22 MED ORDER — PROPOFOL 1000 MG/100ML IV EMUL
INTRAVENOUS | Status: AC
  Filled 2023-09-22: qty 100

## 2023-09-22 MED ORDER — DEXAMETHASONE SODIUM PHOSPHATE 10 MG/ML IJ SOLN
8.0000 mg | Freq: Once | INTRAMUSCULAR | Status: AC
  Administered 2023-09-22: 8 mg via INTRAVENOUS

## 2023-09-22 MED ORDER — DULOXETINE HCL 20 MG PO CPEP
20.0000 mg | ORAL_CAPSULE | Freq: Every day | ORAL | Status: DC
  Administered 2023-09-22 – 2023-09-23 (×2): 20 mg via ORAL
  Filled 2023-09-22 (×2): qty 1

## 2023-09-22 MED ORDER — ONDANSETRON HCL 4 MG/2ML IJ SOLN: 4.0000 mg | Freq: Once | INTRAMUSCULAR | Status: DC | PRN

## 2023-09-22 MED ORDER — MIDAZOLAM HCL 2 MG/2ML IJ SOLN
INTRAMUSCULAR | Status: AC
  Filled 2023-09-22: qty 2

## 2023-09-22 SURGICAL SUPPLY — 40 items
ADH SKN CLS APL DERMABOND .7 (GAUZE/BANDAGES/DRESSINGS) ×1
BAG COUNTER SPONGE SURGICOUNT (BAG) IMPLANT
BAG SPEC THK2 15X12 ZIP CLS (MISCELLANEOUS)
BAG SPNG CNTER NS LX DISP (BAG)
BAG ZIPLOCK 12X15 (MISCELLANEOUS) IMPLANT
BLADE SAG 18X100X1.27 (BLADE) ×1 IMPLANT
COVER PERINEAL POST (MISCELLANEOUS) ×1 IMPLANT
COVER SURGICAL LIGHT HANDLE (MISCELLANEOUS) ×1 IMPLANT
CUP ACETBLR 54 OD PINNACLE (Hips) IMPLANT
DERMABOND ADVANCED .7 DNX12 (GAUZE/BANDAGES/DRESSINGS) ×1 IMPLANT
DRAPE FOOT SWITCH (DRAPES) ×1 IMPLANT
DRAPE STERI IOBAN 125X83 (DRAPES) ×1 IMPLANT
DRAPE U-SHAPE 47X51 STRL (DRAPES) ×2 IMPLANT
DRSG AQUACEL AG ADV 3.5X10 (GAUZE/BANDAGES/DRESSINGS) ×1 IMPLANT
DURAPREP 26ML APPLICATOR (WOUND CARE) ×1 IMPLANT
ELECT REM PT RETURN 15FT ADLT (MISCELLANEOUS) ×1 IMPLANT
GLOVE BIO SURGEON STRL SZ 6.5 (GLOVE) IMPLANT
GLOVE BIO SURGEON STRL SZ8 (GLOVE) ×1 IMPLANT
GLOVE BIOGEL PI IND STRL 6.5 (GLOVE) IMPLANT
GLOVE BIOGEL PI IND STRL 7.0 (GLOVE) IMPLANT
GLOVE BIOGEL PI IND STRL 8 (GLOVE) ×1 IMPLANT
GOWN STRL REUS W/ TWL LRG LVL3 (GOWN DISPOSABLE) ×1 IMPLANT
GOWN STRL REUS W/TWL LRG LVL3 (GOWN DISPOSABLE) ×1
HEAD CERAMIC DELTA 36 PLUS 1.5 (Hips) IMPLANT
HOLDER FOLEY CATH W/STRAP (MISCELLANEOUS) ×1 IMPLANT
KIT TURNOVER KIT A (KITS) IMPLANT
LINER MARATHON NEUT +4X54X36 (Hips) IMPLANT
MANIFOLD NEPTUNE II (INSTRUMENTS) ×1 IMPLANT
PACK ANTERIOR HIP CUSTOM (KITS) ×1 IMPLANT
PENCIL SMOKE EVACUATOR COATED (MISCELLANEOUS) ×1 IMPLANT
SPIKE FLUID TRANSFER (MISCELLANEOUS) ×1 IMPLANT
STEM FEMORAL SZ 6MM STD ACTIS (Stem) IMPLANT
SUT ETHIBOND NAB CT1 #1 30IN (SUTURE) ×1 IMPLANT
SUT MNCRL AB 4-0 PS2 18 (SUTURE) ×1 IMPLANT
SUT STRATAFIX 0 PDS 27 VIOLET (SUTURE) ×1
SUT VIC AB 2-0 CT1 27 (SUTURE) ×2
SUT VIC AB 2-0 CT1 TAPERPNT 27 (SUTURE) ×2 IMPLANT
SUTURE STRATFX 0 PDS 27 VIOLET (SUTURE) ×1 IMPLANT
TRAY FOLEY MTR SLVR 16FR STAT (SET/KITS/TRAYS/PACK) ×1 IMPLANT
TUBE SUCTION HIGH CAP CLEAR NV (SUCTIONS) ×1 IMPLANT

## 2023-09-22 NOTE — Op Note (Signed)
OPERATIVE REPORT- TOTAL HIP ARTHROPLASTY   PREOPERATIVE DIAGNOSIS: Osteoarthritis of the Right hip.   POSTOPERATIVE DIAGNOSIS: Osteoarthritis of the Right  hip.   PROCEDURE: Right total hip arthroplasty, anterior approach.   SURGEON: Ollen Gross, MD   ASSISTANT: Weston Brass, PA-C  ANESTHESIA:  Spinal  ESTIMATED BLOOD LOSS:-550 mL    DRAINS: None  COMPLICATIONS: None   CONDITION: PACU - hemodynamically stable.   BRIEF CLINICAL NOTE: Joshua Gomez is a 67 y.o. male who has advanced arthritis of their Right  hip with progressively worsening pain and  dysfunction.The patient has failed nonoperative management and presents for  total hip arthroplasty.   PROCEDURE IN DETAIL: After successful administration of spinal  anesthetic, the traction boots for the [facility] bed were placed on both  feet and the patient was placed onto the [facility] bed, boots placed into the leg  holders. The Right hip was then isolated from the perineum with plastic  drapes and prepped and draped in the usual sterile fashion. ASIS and  greater trochanter were marked and a oblique incision was made, starting  at about 1 cm lateral and 2 cm distal to the ASIS and coursing towards  the anterior cortex of the femur. The skin was cut with a 10 blade  through subcutaneous tissue to the level of the fascia overlying the  tensor fascia lata muscle. The fascia was then incised in line with the  incision at the junction of the anterior third and posterior 2/3rd. The  muscle was teased off the fascia and then the interval between the TFL  and the rectus was developed. The Hohmann retractor was then placed at  the top of the femoral neck over the capsule. The vessels overlying the  capsule were cauterized and the fat on top of the capsule was removed.  A Hohmann retractor was then placed anterior underneath the rectus  femoris to give exposure to the entire anterior capsule. A T-shaped  capsulotomy was  performed. The edges were tagged and the femoral head  was identified.       Osteophytes are removed off the superior acetabulum.  The femoral neck was then cut in situ with an oscillating saw. Traction  was then applied to the left lower extremity utilizing the [facility]  traction. The femoral head was then removed. Retractors were placed  around the acetabulum and then circumferential removal of the labrum was  performed. Osteophytes were also removed. Reaming starts at 49 mm to  medialize and  Increased in 2 mm increments to 53 mm. We reamed in  approximately 40 degrees of abduction, 20 degrees anteversion. A 54 mm  pinnacle acetabular shell was then impacted in anatomic position under  fluoroscopic guidance with excellent purchase. We did not need to place  any additional dome screws. A 36 mm neutral + 4 marathon liner was then  placed into the acetabular shell.       The femoral lift was then placed along the lateral aspect of the femur  just distal to the vastus ridge. The leg was  externally rotated and capsule  was stripped off the inferior aspect of the femoral neck down to the  level of the lesser trochanter, this was done with electrocautery. The femur was lifted after this was performed. The  leg was then placed in an extended and adducted position essentially delivering the femur. We also removed the capsule superiorly and the piriformis from the piriformis fossa to gain excellent exposure  of the  proximal femur. Rongeur was used to remove some cancellous bone to get  into the lateral portion of the proximal femur for placement of the  initial starter reamer. The starter broaches was placed  the starter broach  and was shown to go down the center of the canal. Broaching  with the Actis system was then performed starting at size 0  coursing  Up to size 6. A size 6 had excellent torsional and rotational  and axial stability. The trial standard offset neck was then placed  with a 36 +  1.5 trial head. The hip was then reduced. We confirmed that  the stem was in the canal both on AP and lateral x-rays. It also has excellent sizing. The hip was reduced with outstanding stability through full extension and full external rotation.. AP pelvis was taken and the leg lengths were measured and found to be equal. Hip was then dislocated again and the femoral head and neck removed. The  femoral broach was removed. Size 6 Actis stem with a standard offset  neck was then impacted into the femur following native anteversion. Has  excellent purchase in the canal. Excellent torsional and rotational and  axial stability. It is confirmed to be in the canal on AP and lateral  fluoroscopic views. The 36 + 1.5 ceramic head was placed and the hip  reduced with outstanding stability. Again AP pelvis was taken and it  confirmed that the leg lengths were equal. The wound was then copiously  irrigated with saline solution and the capsule reattached and repaired  with Ethibond suture. 30 ml of .25% Bupivicaine was  injected into the capsule and into the edge of the tensor fascia lata as well as subcutaneous tissue. The fascia overlying the tensor fascia lata was then closed with a running #1 V-Loc. Subcu was closed with interrupted 2-0 Vicryl and subcuticular running 4-0 Monocryl. Incision was cleaned  and dried. Steri-Strips and a bulky sterile dressing applied. The patient was awakened and transported to  recovery in stable condition.        Please note that a surgical assistant was a medical necessity for this procedure to perform it in a safe and expeditious manner. Assistant was necessary to provide appropriate retraction of vital neurovascular structures and to prevent femoral fracture and allow for anatomic placement of the prosthesis.  Ollen Gross, M.D.

## 2023-09-22 NOTE — Anesthesia Postprocedure Evaluation (Signed)
Anesthesia Post Note  Patient: Joshua Gomez  Procedure(s) Performed: TOTAL HIP ARTHROPLASTY ANTERIOR APPROACH (Right: Hip)     Patient location during evaluation: PACU Anesthesia Type: Spinal Level of consciousness: oriented and awake and alert Pain management: pain level controlled Vital Signs Assessment: post-procedure vital signs reviewed and stable Respiratory status: spontaneous breathing, respiratory function stable and nonlabored ventilation Cardiovascular status: blood pressure returned to baseline and stable Postop Assessment: no headache, no backache, no apparent nausea or vomiting, spinal receding and patient able to bend at knees Anesthetic complications: no   No notable events documented.  Last Vitals:  Vitals:   09/22/23 1345 09/22/23 1400  BP: 101/65 105/66  Pulse: (!) 58 (!) 56  Resp: 19 14  Temp:    SpO2: 95% 95%    Last Pain:  Vitals:   09/22/23 1400  TempSrc:   PainSc: 0-No pain                 Joenathan Sakuma A.

## 2023-09-22 NOTE — Transfer of Care (Signed)
Immediate Anesthesia Transfer of Care Note  Patient: Joshua Gomez  Procedure(s) Performed: TOTAL HIP ARTHROPLASTY ANTERIOR APPROACH (Right: Hip)  Patient Location: PACU  Anesthesia Type:MAC and Spinal  Level of Consciousness: awake, alert , oriented, and patient cooperative  Airway & Oxygen Therapy: Patient Spontanous Breathing and Patient connected to face mask oxygen  Post-op Assessment: Report given to RN and Post -op Vital signs reviewed and stable  Post vital signs: Reviewed and stable  Last Vitals:  Vitals Value Taken Time  BP 73/54 09/22/23 1216  Temp    Pulse 72 09/22/23 1219  Resp 14 09/22/23 1219  SpO2 98 % 09/22/23 1219  Vitals shown include unfiled device data.  Last Pain:  Vitals:   09/22/23 0829  TempSrc: Oral  PainSc:          Complications: No notable events documented.

## 2023-09-22 NOTE — Evaluation (Signed)
Physical Therapy Evaluation Patient Details Name: NATHANUEL CABREJA MRN: [id number] DOB: 1956/11/30 Today's Date: 09/22/2023  History of Present Illness  67 yo male presents to therapy s/p R THA,anterior approach on 09/22/2023 due to failure of conservative measures. Pt PMH Includes but is not limited to: CAD, NTEMI, HTN, HLD, ACS, BPH, HOH-B hearing aids, and s/p cardiac cath.  Clinical Impression     RAEF SPRIGG is a 67 y.o. male POD 0 s/p R THA. Patient reports IND with mobility at baseline. Patient is now limited by functional impairments (see PT problem list below) and requires S for bed mobility and CGA for transfers. Patient was able to ambulate 50 feet with RW and CGA level of assist. Patient instructed in exercise to facilitate ROM and circulation to manage edema.  Patient will benefit from continued skilled PT interventions to address impairments and progress towards PLOF. Acute PT will follow to progress mobility and stair training in preparation for safe discharge home with family support and HEP.       If plan is discharge home, recommend the following: A little help with walking and/or transfers;A little help with bathing/dressing/bathroom;Assistance with cooking/housework;Assist for transportation;Help with stairs or ramp for entrance   Can travel by private vehicle        Equipment Recommendations None recommended by PT  Recommendations for Other Services       Functional Status Assessment Patient has had a recent decline in their functional status and demonstrates the ability to make significant improvements in function in a reasonable and predictable amount of time.     Precautions / Restrictions Precautions Precautions: Fall Restrictions Weight Bearing Restrictions: No      Mobility  Bed Mobility Overal bed mobility: Needs Assistance Bed Mobility: Supine to Sit     Supine to sit: Supervision, HOB elevated     General bed mobility comments: min cues     Transfers Overall transfer level: Needs assistance Equipment used: Rolling walker (2 wheels) Transfers: Sit to/from Stand Sit to Stand: From elevated surface, Contact guard assist           General transfer comment: min cues for push to stand from EOB    Ambulation/Gait Ambulation/Gait assistance: Contact guard assist Gait Distance (Feet): 50 Feet Assistive device: Rolling walker (2 wheels) Gait Pattern/deviations: Step-to pattern, Antalgic, Trunk flexed Gait velocity: decreased     General Gait Details: slight trunk flexion, good B foot clearance with step almost through pattern  Stairs            Wheelchair Mobility     Tilt Bed    Modified Rankin (Stroke Patients Only)       Balance Overall balance assessment: Needs assistance Sitting-balance support: Feet supported Sitting balance-Leahy Scale: Good     Standing balance support: Bilateral upper extremity supported, During functional activity, Reliant on assistive device for balance Standing balance-Leahy Scale: Fair Standing balance comment: static standing no UE support                             Pertinent Vitals/Pain Pain Assessment Pain Assessment: 0-10 Pain Score: 4  Pain Location: R hip Pain Descriptors / Indicators: Aching, Constant, Discomfort, Operative site guarding Pain Intervention(s): Limited activity within patient's tolerance, Premedicated before session, Monitored during session, Repositioned, Ice applied    Home Living Family/patient expects to be discharged to:: Private residence Living Arrangements: Spouse/significant other Available Help at Discharge: Family Type of Home: House Home  Access: Stairs to enter Entrance Stairs-Rails: None Entrance Stairs-Number of Steps: 2 non sequential steps   Home Layout: One level Home Equipment: Agricultural consultant (2 wheels);Cane - single point      Prior Function Prior Level of Function : Independent/Modified Independent              Mobility Comments: IND no AD for all ADLs, self care tasks and IADLs, [place] and is [place]       Extremity/Trunk Assessment        Lower Extremity Assessment Lower Extremity Assessment: RLE deficits/detail RLE Deficits / Details: ankle DF/PF 5/5 RLE Sensation: WNL    Cervical / Trunk Assessment Cervical / Trunk Assessment: Normal  Communication   Communication Communication: Hearing impairment (B hearing aids requires inceased volume)  Cognition Arousal: Alert Behavior During Therapy: WFL for tasks assessed/performed Overall Cognitive Status: Within Functional Limits for tasks assessed                                          General Comments      Exercises Total Joint Exercises Ankle Circles/Pumps: AROM, Both, 15 reps   Assessment/Plan    PT Assessment Patient needs continued PT services  PT Problem List Decreased strength;Decreased range of motion;Decreased activity tolerance;Decreased balance;Decreased mobility;Decreased coordination;Pain       PT Treatment Interventions DME instruction;Gait training;Stair training;Functional mobility training;Therapeutic activities;Therapeutic exercise;Balance training;Neuromuscular re-education;Patient/family education;Modalities    PT Goals (Current goals can be found in the Care Plan section)  Acute Rehab PT Goals Patient Stated Goal: to be able to squirl hunt with the dogs PT Goal Formulation: With patient Time For Goal Achievement: 10/06/23 Potential to Achieve Goals: Good    Frequency 7X/week     Co-evaluation               AM-PAC PT "6 Clicks" Mobility  Outcome Measure Help needed turning from your back to your side while in a flat bed without using bedrails?: None Help needed moving from lying on your back to sitting on the side of a flat bed without using bedrails?: A Little Help needed moving to and from a bed to a chair (including a wheelchair)?: A Little Help  needed standing up from a chair using your arms (e.g., wheelchair or bedside chair)?: A Little Help needed to walk in hospital room?: A Little Help needed climbing 3-5 steps with a railing? : A Lot 6 Click Score: 18    End of Session Equipment Utilized During Treatment: Gait belt Activity Tolerance: Patient tolerated treatment well;No increased pain Patient left: in chair;with call bell/phone within reach;with family/visitor present;with chair alarm set Nurse Communication: Mobility status PT Visit Diagnosis: Unsteadiness on feet (R26.81);Other abnormalities of gait and mobility (R26.89);Muscle weakness (generalized) (M62.81);Pain;Difficulty in walking, not elsewhere classified (R26.2) Pain - Right/Left: Right Pain - part of body: Hip;Leg    Time: 4098-1191 PT Time Calculation (min) (ACUTE ONLY): 16 min   Charges:   PT Evaluation $PT Eval Low Complexity: 1 Low   PT General Charges $$ ACUTE PT VISIT: 1 Visit         Johnny Bridge, PT Acute Rehab   Jacqualyn Posey 09/22/2023, 6:03 PM

## 2023-09-22 NOTE — Discharge Instructions (Signed)
Joshua Gross, MD Total Joint Specialist [facility] [address], [address] [place], [place] [address] [phone]  ANTERIOR APPROACH TOTAL HIP REPLACEMENT POSTOPERATIVE DIRECTIONS     Hip Rehabilitation, Guidelines Following Surgery  The results of a hip operation are greatly improved after range of motion and muscle strengthening exercises. Follow all safety measures which are given to protect your hip. If any of these exercises cause increased pain or swelling in your joint, decrease the amount until you are comfortable again. Then slowly increase the exercises. Call your caregiver if you have problems or questions.   BLOOD CLOT PREVENTION Take an 81 mg Aspirin two times a day for three weeks following surgery. Then resume one 81 mg Aspirin once a day. You may resume your vitamins/supplements upon discharge from the hospital. Do not take any NSAIDs (Advil, Aleve, Ibuprofen, Meloxicam, etc.) until you are 3 weeks out from surgery  HOME CARE INSTRUCTIONS  Remove items at home which could result in a fall. This includes throw rugs or furniture in walking pathways.  ICE to the affected hip as frequently as 20-30 minutes an hour and then as needed for pain and swelling. Continue to use ice on the hip for pain and swelling from surgery. You may notice swelling that will progress down to the foot and ankle. This is normal after surgery. Elevate the leg when you are not up walking on it.   Continue to use the breathing machine which will help keep your temperature down.  It is common for your temperature to cycle up and down following surgery, especially at night when you are not up moving around and exerting yourself.  The breathing machine keeps your lungs expanded and your temperature down.  DIET You may resume your previous home diet once your are discharged from the hospital.  DRESSING / WOUND CARE / SHOWERING You have an adhesive waterproof bandage over the  incision. Leave this in place until your first follow-up appointment. Once you remove this you will not need to place another bandage.  You may begin showering 3 days following surgery, but do not submerge the incision under water.  ACTIVITY For the first 3-5 days, it is important to rest and keep the operative leg elevated. You should, as a general rule, rest for 50 minutes and walk/stretch for 10 minutes per hour. After 5 days, you may slowly increase activity as tolerated.  Perform the exercises you were provided twice a day for about 15-20 minutes each session. Begin these 2 days following surgery. Walk with your walker as instructed. Use the walker until you are comfortable transitioning to a cane. Walk with the cane in the opposite hand of the operative leg. You may discontinue the cane once you are comfortable and walking steadily. Avoid periods of inactivity such as sitting longer than an hour when not asleep. This helps prevent blood clots.  Do not drive a car for 6 weeks or until released by your surgeon.  Do not drive while taking narcotics.  TED HOSE STOCKINGS Wear the elastic stockings on both legs for three weeks following surgery during the day. You may remove them at night while sleeping.  WEIGHT BEARING Weight bearing as tolerated with assist device (walker, cane, etc) as directed, use it as long as suggested by your surgeon or therapist, typically at least 4-6 weeks.  POSTOPERATIVE CONSTIPATION PROTOCOL Constipation - defined medically as fewer than three stools per week and severe constipation as less than one stool per week.  less than one stool per week.  One of the most common issues patients have following surgery is constipation.  Even if you have a regular bowel pattern at home, your normal regimen is likely to be disrupted due to multiple reasons following surgery.  Combination of anesthesia, postoperative narcotics, change in appetite and fluid intake all can  affect your bowels.  In order to avoid complications following surgery, here are some recommendations in order to help you during your recovery period.  Colace (docusate) - Pick up an over-the-counter form of Colace or another stool softener and take twice a day as long as you are requiring postoperative pain medications.  Take with a full glass of water daily.  If you experience loose stools or diarrhea, hold the colace until you stool forms back up.  If your symptoms do not get better within 1 week or if they get worse, check with your doctor. Dulcolax (bisacodyl) - Pick up over-the-counter and take as directed by the product packaging as needed to assist with the movement of your bowels.  Take with a full glass of water.  Use this product as needed if not relieved by Colace only.  MiraLax (polyethylene glycol) - Pick up over-the-counter to have on hand.  MiraLax is a solution that will increase the amount of water in your bowels to assist with bowel movements.  Take as directed and can mix with a glass of water, juice, soda, coffee, or tea.  Take if you go more than two days without a movement.Do not use MiraLax more than once per day. Call your doctor if you are still constipated or irregular after using this medication for 7 days in a row.  If you continue to have problems with postoperative constipation, please contact the office for further assistance and recommendations.  If you experience "the worst abdominal pain ever" or develop nausea or vomiting, please contact the office immediatly for further recommendations for treatment.  ITCHING  If you experience itching with your medications, try taking only a single pain pill, or even half a pain pill at a time.  You can also use Benadryl over the counter for itching or also to help with sleep.   MEDICATIONS See your medication summary on the "After Visit Summary" that the nursing staff will review with you prior to discharge.  You may have some home  medications which will be placed on hold until you complete the course of blood thinner medication.  It is important for you to complete the blood thinner medication as prescribed by your surgeon.  Continue your approved medications as instructed at time of discharge.  PRECAUTIONS If you experience chest pain or shortness of breath - call 911 immediately for transfer to the hospital emergency department.  If you develop a fever greater that 101 F, purulent drainage from wound, increased redness or drainage from wound, foul odor from the wound/dressing, or calf pain - CONTACT YOUR SURGEON.                                                   FOLLOW-UP APPOINTMENTS Make sure you keep all of your appointments after your operation with your surgeon and caregivers. You should call the office at the above phone number and make an appointment for approximately two weeks after the date of your surgery or on the   date instructed by your surgeon outlined in the "After Visit Summary".  RANGE OF MOTION AND STRENGTHENING EXERCISES  These exercises are designed to help you keep full movement of your hip joint. Follow your caregiver's or physical therapist's instructions. Perform all exercises about fifteen times, three times per day or as directed. Exercise both hips, even if you have had only one joint replacement. These exercises can be done on a training (exercise) mat, on the floor, on a table or on a bed. Use whatever works the best and is most comfortable for you. Use music or television while you are exercising so that the exercises are a pleasant break in your day. This will make your life better with the exercises acting as a break in routine you can look forward to.  Lying on your back, slowly slide your foot toward your buttocks, raising your knee up off the floor. Then slowly slide your foot back down until your leg is straight again.  Lying on your back spread your legs as far apart as you can without causing  discomfort.  Lying on your side, raise your upper leg and foot straight up from the floor as far as is comfortable. Slowly lower the leg and repeat.  Lying on your back, tighten up the muscle in the front of your thigh (quadriceps muscles). You can do this by keeping your leg straight and trying to raise your heel off the floor. This helps strengthen the largest muscle supporting your knee.  Lying on your back, tighten up the muscles of your buttocks both with the legs straight and with the knee bent at a comfortable angle while keeping your heel on the floor.   POST-OPERATIVE OPIOID TAPER INSTRUCTIONS: It is important to wean off of your opioid medication as soon as possible. If you do not need pain medication after your surgery it is ok to stop day one. Opioids include: Codeine, Hydrocodone(Norco, Vicodin), Oxycodone(Percocet, oxycontin) and hydromorphone amongst others.  Long term and even short term use of opiods can cause: Increased pain response Dependence Constipation Depression Respiratory depression And more.  Withdrawal symptoms can include Flu like symptoms Nausea, vomiting And more Techniques to manage these symptoms Hydrate well Eat regular healthy meals Stay active Use relaxation techniques(deep breathing, meditating, yoga) Do Not substitute Alcohol to help with tapering If you have been on opioids for less than two weeks and do not have pain than it is ok to stop all together.  Plan to wean off of opioids This plan should start within one week post op of your joint replacement. Maintain the same interval or time between taking each dose and first decrease the dose.  Cut the total daily intake of opioids by one tablet each day Next start to increase the time between doses. The last dose that should be eliminated is the evening dose.   IF YOU ARE TRANSFERRED TO A SKILLED REHAB FACILITY If the patient is transferred to a skilled rehab facility following release from the  hospital, a list of the current medications will be sent to the facility for the patient to continue.  When discharged from the skilled rehab facility, please have the facility set up the patient's Home Health Physical Therapy prior to being released. Also, the skilled facility will be responsible for providing the patient with their medications at time of release from the facility to include their pain medication, the muscle relaxants, and their blood thinner medication. If the patient is still at the rehab facility   up appointment, the skilled rehab facility will also need to assist the patient in arranging follow up appointment in our office and any transportation needs.  MAKE SURE YOU:  Understand these instructions.  Get help right away if you are not doing well or get worse.    DENTAL ANTIBIOTICS:  In most cases prophylactic antibiotics for Dental procdeures after total joint surgery are not necessary.  Exceptions are as follows:  1. History of prior total joint infection  2. Severely immunocompromised (Organ Transplant, cancer chemotherapy, Rheumatoid biologic meds such as Humera)  3. Poorly controlled diabetes (A1C &gt; 8.0, blood glucose over 200)  If you have one of these conditions, contact your surgeon for an antibiotic prescription, prior to your dental procedure.    Pick up stool softner and laxative for home use following surgery while on pain medications. Do not submerge incision under water. Please use good hand washing techniques while changing dressing each day. May shower starting three days after surgery. Please use a clean towel to pat the incision dry following showers. Continue to use ice for pain and swelling after surgery. Do not use any lotions or creams on the incision until instructed by your surgeon.  

## 2023-09-22 NOTE — Interval H&P Note (Signed)
History and Physical Interval Note:  09/22/2023 8:27 AM  Joshua Gomez  has presented today for surgery, with the diagnosis of right hip osteoarthritis.  The various methods of treatment have been discussed with the patient and family. After consideration of risks, benefits and other options for treatment, the patient has consented to  Procedure(s): TOTAL HIP ARTHROPLASTY ANTERIOR APPROACH (Right) as a surgical intervention.  The patient's history has been reviewed, patient examined, no change in status, stable for surgery.  I have reviewed the patient's chart and labs.  Questions were answered to the patient's satisfaction.     Homero Fellers Joshua Gomez

## 2023-09-22 NOTE — Anesthesia Procedure Notes (Signed)
Spinal  Patient location during procedure: OR Start time: 09/22/2023 10:34 AM End time: 09/22/2023 10:36 AM Reason for block: surgical anesthesia Staffing Performed: anesthesiologist  Anesthesiologist: Mal Amabile, MD Performed by: Mal Amabile, MD Authorized by: Mal Amabile, MD   Preanesthetic Checklist Completed: patient identified, IV checked, site marked, risks and benefits discussed, surgical consent, monitors and equipment checked, pre-op evaluation and timeout performed Spinal Block Patient position: sitting Prep: DuraPrep and site prepped and draped Patient monitoring: heart rate, cardiac monitor, continuous pulse ox and blood pressure Approach: midline Location: L3-4 Injection technique: single-shot Needle Needle type: Pencan  Needle gauge: 24 G Needle length: 9 cm Needle insertion depth: 7 cm Assessment Sensory level: T6 Events: CSF return Additional Notes Patient tolerated procedure well. Adequate sensory level.

## 2023-09-22 NOTE — Anesthesia Procedure Notes (Signed)
Procedure Name: MAC Date/Time: 09/22/2023 10:30 AM  Performed by: Elisabeth Cara, CRNAPre-anesthesia Checklist: Patient identified, Emergency Drugs available, Suction available, Patient being monitored and Timeout performed Patient Re-evaluated:Patient Re-evaluated prior to induction Oxygen Delivery Method: Simple face mask Placement Confirmation: positive ETCO2 Dental Injury: Teeth and Oropharynx as per pre-operative assessment

## 2023-09-23 ENCOUNTER — Encounter (HOSPITAL_COMMUNITY): Payer: Self-pay | Admitting: Orthopedic Surgery

## 2023-09-23 DIAGNOSIS — M1611 Unilateral primary osteoarthritis, right hip: Secondary | ICD-10-CM | POA: Diagnosis not present

## 2023-09-23 LAB — CBC
HCT: 43.2 % (ref 39.0–52.0)
Hemoglobin: 14.3 g/dL (ref 13.0–17.0)
MCH: 31.6 pg (ref 26.0–34.0)
MCHC: 33.1 g/dL (ref 30.0–36.0)
MCV: 95.4 fL (ref 80.0–100.0)
Platelets: 225 10*3/uL (ref 150–400)
RBC: 4.53 MIL/uL (ref 4.22–5.81)
RDW: 12.2 % (ref 11.5–15.5)
WBC: 16.5 10*3/uL — ABNORMAL HIGH (ref 4.0–10.5)
nRBC: 0 % (ref 0.0–0.2)

## 2023-09-23 LAB — BASIC METABOLIC PANEL
Anion gap: 9 (ref 5–15)
BUN: 22 mg/dL (ref 8–23)
CO2: 23 mmol/L (ref 22–32)
Calcium: 8.3 mg/dL — ABNORMAL LOW (ref 8.9–10.3)
Chloride: 103 mmol/L (ref 98–111)
Creatinine, Ser: 0.83 mg/dL (ref 0.61–1.24)
GFR, Estimated: >60 mL/min (ref >60)
Glucose, Bld: 143 mg/dL — ABNORMAL HIGH (ref 70–99)
Potassium: 4.1 mmol/L (ref 3.5–5.1)
Sodium: 135 mmol/L (ref 135–145)

## 2023-09-23 MED ORDER — ONDANSETRON HCL 4 MG PO TABS: 4.0000 mg | ORAL_TABLET | Freq: Four times a day (QID) | ORAL | 0 refills | Status: AC | PRN

## 2023-09-23 MED ORDER — TRAMADOL HCL 50 MG PO TABS: 50.0000 mg | ORAL_TABLET | Freq: Four times a day (QID) | ORAL | 0 refills | Status: AC | PRN

## 2023-09-23 MED ORDER — HYDROCODONE-ACETAMINOPHEN 5-325 MG PO TABS: 1.0000 | ORAL_TABLET | Freq: Four times a day (QID) | ORAL | 0 refills | Status: AC | PRN

## 2023-09-23 MED ORDER — ASPIRIN 81 MG PO CHEW: 81.0000 mg | CHEWABLE_TABLET | Freq: Two times a day (BID) | ORAL | 0 refills | Status: AC

## 2023-09-23 MED ORDER — METHOCARBAMOL 500 MG PO TABS: 500.0000 mg | ORAL_TABLET | Freq: Four times a day (QID) | ORAL | 0 refills | Status: AC | PRN

## 2023-09-23 NOTE — Progress Notes (Signed)
Physical Therapy Treatment Patient Details Name: Joshua Gomez MRN: [id number] DOB: 03-20-1956 Today's Date: 09/23/2023   History of Present Illness 67 yo male presents to therapy s/p R THA,anterior approach on 09/22/2023 due to failure of conservative measures. Pt PMH Includes but is not limited to: CAD, NTEMI, HTN, HLD, ACS, BPH, HOH-B hearing aids, and s/p cardiac cath.    PT Comments  Pt with improvement in pain this session, needing less assistance with mobility. Pt amb 80 ft with RW, supv, step to progressing to step through, no LE buckling or unsteadiness noted. Pt provided written/illustrated HEP and verbalizes understanding. Pt declines stair training again, reports confidence in morning performance as he had to ascend/descend 3 steps with single handrail and at home he only has to ascend/descend 1 step with single handrail. Pt reports ready to d/c home with spouse support; RN notified.   If plan is discharge home, recommend the following: A little help with walking and/or transfers;A little help with bathing/dressing/bathroom;Assistance with cooking/housework;Assist for transportation;Help with stairs or ramp for entrance   Can travel by private vehicle        Equipment Recommendations  None recommended by PT    Recommendations for Other Services       Precautions / Restrictions Precautions Precautions: Fall Restrictions Weight Bearing Restrictions: No RLE Weight Bearing: Weight bearing as tolerated     Mobility  Bed Mobility Overal bed mobility: Needs Assistance Bed Mobility: Supine to Sit, Sit to Supine     Supine to sit: Supervision Sit to supine: Supervision   General bed mobility comments: supv, self assisting R leg as needed out of and into bed, no cues or physical assist    Transfers Overall transfer level: Needs assistance Equipment used: Rolling walker (2 wheels) Transfers: Sit to/from Stand Sit to Stand: Supervision, From elevated surface            General transfer comment: elevated bed due to pt's height, BUE assisting to power up to standing, supv for safety    Ambulation/Gait Ambulation/Gait assistance: Supervision Gait Distance (Feet): 80 Feet Assistive device: Rolling walker (2 wheels) Gait Pattern/deviations: Step-to pattern, Decreased stride length, Antalgic, Trunk flexed Gait velocity: decreased     General Gait Details: step to pattern with antalgic gait pattern initially improving with distance, verbal cues for step length and RW management to improve safety, trunk flexed improves with verbal cues   Stairs Stairs:  (pt declines, reports feeling confident from morning session)    Wheelchair Mobility     Tilt Bed    Modified Rankin (Stroke Patients Only)       Balance Overall balance assessment: Needs assistance Sitting-balance support: Feet supported Sitting balance-Leahy Scale: Good     Standing balance support: Reliant on assistive device for balance, During functional activity Standing balance-Leahy Scale: Fair Standing balance comment: static standing without UE support, dynamic with RW                            Cognition Arousal: Alert Behavior During Therapy: WFL for tasks assessed/performed Overall Cognitive Status: Within Functional Limits for tasks assessed                                          Exercises Total Joint Exercises Ankle Circles/Pumps: Supine, AROM, Strengthening, Both, 10 reps Quad Sets: Supine, Strengthening, Right, 5 reps Heel  Slides: Supine, AROM, Right, 5 reps Long Arc Quad: Seated, AROM, Strengthening, Right, 5 reps    General Comments        Pertinent Vitals/Pain Pain Assessment Pain Assessment: 0-10 Pain Score: 5  Pain Location: R hip Pain Descriptors / Indicators: Aching, Constant, Discomfort, Sore Pain Intervention(s): Limited activity within patient's tolerance, Monitored during session, Premedicated before session,  Repositioned    Home Living                          Prior Function            PT Goals (current goals can now be found in the care plan section) Acute Rehab PT Goals Patient Stated Goal: to be able to squirl hunt with the dogs PT Goal Formulation: With patient Time For Goal Achievement: 10/06/23 Potential to Achieve Goals: Good Progress towards PT goals: Progressing toward goals    Frequency    7X/week      PT Plan      Co-evaluation              AM-PAC PT "6 Clicks" Mobility   Outcome Measure  Help needed turning from your back to your side while in a flat bed without using bedrails?: None Help needed moving from lying on your back to sitting on the side of a flat bed without using bedrails?: A Little Help needed moving to and from a bed to a chair (including a wheelchair)?: A Little Help needed standing up from a chair using your arms (e.g., wheelchair or bedside chair)?: A Little Help needed to walk in hospital room?: A Little Help needed climbing 3-5 steps with a railing? : A Little 6 Click Score: 19    End of Session Equipment Utilized During Treatment: Gait belt Activity Tolerance: Patient tolerated treatment well Patient left: in bed;with call bell/phone within reach;with bed alarm set Nurse Communication: Mobility status PT Visit Diagnosis: Unsteadiness on feet (R26.81);Other abnormalities of gait and mobility (R26.89);Muscle weakness (generalized) (M62.81);Pain;Difficulty in walking, not elsewhere classified (R26.2) Pain - Right/Left: Right Pain - part of body: Hip;Leg     Time: 2536-6440 PT Time Calculation (min) (ACUTE ONLY): 15 min  Charges:    $Gait Training: 8-22 mins PT General Charges $$ ACUTE PT VISIT: 1 Visit                     Tori Raivyn Kabler PT, DPT 09/23/23, 2:42 PM

## 2023-09-23 NOTE — Plan of Care (Signed)
Discharge instructions given to the patient including medications and follow up.  

## 2023-09-23 NOTE — Care Management Obs Status (Signed)
MEDICARE OBSERVATION STATUS NOTIFICATION   Patient Details  Name: Joshua Gomez MRN: [id number] Date of Birth: Jan 03, 1956   Medicare Observation Status Notification Given:  Yes    HOYLEValentina Gu, LCSW 09/23/2023, 1:16 PM

## 2023-09-23 NOTE — Plan of Care (Signed)
  Problem: Education: Goal: Knowledge of the prescribed therapeutic regimen will improve Outcome: Progressing   Problem: Activity: Goal: Ability to tolerate increased activity will improve Outcome: Progressing   Problem: Clinical Measurements: Goal: Postoperative complications will be avoided or minimized Outcome: Progressing   Problem: Pain Management: Goal: Pain level will decrease with appropriate interventions Outcome: Progressing   Problem: Clinical Measurements: Goal: Ability to maintain clinical measurements within normal limits will improve Outcome: Progressing   Problem: Safety: Goal: Ability to remain free from injury will improve Outcome: Progressing

## 2023-09-23 NOTE — Progress Notes (Signed)
   Subjective: 1 Day Post-Op Procedure(s) (LRB): TOTAL HIP ARTHROPLASTY ANTERIOR APPROACH (Right) Patient reports pain as mild.   Patient seen in rounds by Dr. Lequita Halt. Patient is well, and has had no acute complaints or problems. Denies chest pain or SOB. No issues overnight. Foley catheter removed this AM. We will continue therapy today, ambulated 50' yesterday.   Objective: Vital signs in last 24 hours: Temp:  [97.5 F (36.4 C)-98.2 F (36.8 C)] 97.7 F (36.5 C) (10/08 8295) Pulse Rate:  [53-86] 74 (10/08 0614) Resp:  [10-20] 16 (10/08 0614) BP: (73-131)/(54-86) 124/86 (10/08 0614) SpO2:  [91 %-99 %] 93 % (10/08 0614) Weight:  [120.2 kg] 120.2 kg (10/07 1536)  Intake/Output from previous day:  Intake/Output Summary (Last 24 hours) at 09/23/2023 0833 Last data filed at 09/23/2023 0600 Gross per 24 hour  Intake 2817.45 ml  Output 2000 ml  Net 817.45 ml     Intake/Output this shift: No intake/output data recorded.  Labs: Recent Labs    09/23/23 0348  HGB 14.3   Recent Labs    09/23/23 0348  WBC 16.5*  RBC 4.53  HCT 43.2  PLT 225   Recent Labs    09/23/23 0348  NA 135  K 4.1  CL 103  CO2 23  BUN 22  CREATININE 0.83  GLUCOSE 143*  CALCIUM 8.3*   No results for input(s): "LABPT", "INR" in the last 72 hours.  Exam: General - Patient is Alert and Oriented Extremity - Neurologically intact Neurovascular intact Sensation intact distally Dorsiflexion/Plantar flexion intact Dressing - dressing C/D/I Motor Function - intact, moving foot and toes well on exam.   Past Medical History:  Diagnosis Date   Arthritis    BPH (benign prostatic hypertrophy)    Coronary artery disease    Feeling of incomplete bladder emptying    History of kidney stones    History of MRSA infection    2005--  left lower leg abscess w/ MRSA   Hyperlipidemia    Hypertension    Idiopathic peripheral neuropathy    Lower urinary tract symptoms (LUTS)    Myocardial infarction  (HCC)    Pre-diabetes    Wears glasses    Wears hearing aid    BILATERAL    Assessment/Plan: 1 Day Post-Op Procedure(s) (LRB): TOTAL HIP ARTHROPLASTY ANTERIOR APPROACH (Right) Principal Problem:   OA (osteoarthritis) of knee Active Problems:   Primary osteoarthritis of right hip  Estimated body mass index is 34.02 kg/m as calculated from the following:   Height as of this encounter: 6\' 2"  (1.88 m).   Weight as of this encounter: 120.2 kg. Advance diet Up with therapy D/C IV fluids  DVT Prophylaxis - Aspirin Weight bearing as tolerated. Continue therapy.  Plan is to go Home after hospital stay. Plan for discharge with HEP later today if progresses with therapy and meeting goals. Follow-up in the office in 2 weeks.  The PDMP database was reviewed today prior to any opioid medications being prescribed to this patient.  Arther Abbott, PA-C Orthopedic Surgery [phone] 09/23/2023, 8:33 AM

## 2023-09-23 NOTE — Progress Notes (Signed)
Physical Therapy Treatment Patient Details Name: Joshua Gomez MRN: [id number] DOB: 1956-07-02 Today's Date: 09/23/2023   History of Present Illness 67 yo male presents to therapy s/p R THA,anterior approach on 09/22/2023 due to failure of conservative measures. Pt PMH Includes but is not limited to: CAD, NTEMI, HTN, HLD, ACS, BPH, HOH-B hearing aids, and s/p cardiac cath.    PT Comments  Pt more painful today, verbalizes frustrations with pain and motivated to return home. Pt needing min A to power up to standing despite elevated bed height, with time improvement in hip mobility and strength allowing pt to rise from lower seated recliner with CGA. Pt with antalgic gait pattern, min A to CGA with recliner follow, educated pt on upright posture, positioning within RW frame, flat foot posture and decreased speed to improve safety. Completed gait training with CGA+2 for safety, pt able to ascend/descend 3 steps with L handrail mimicking home setup of 1 step with L handrail. Pt tolerates seated exercises without pain increase. Provided written/illustrated HEP and educated pt on mobility at home. Pt may benefit from continued post acute PT services pending progress with HEP at discharge. Will return for 2nd session to progress mobility with plan to return home with spouse support and HEP after session.   If plan is discharge home, recommend the following: A little help with walking and/or transfers;A little help with bathing/dressing/bathroom;Assistance with cooking/housework;Assist for transportation;Help with stairs or ramp for entrance   Can travel by private vehicle        Equipment Recommendations  None recommended by PT    Recommendations for Other Services       Precautions / Restrictions Precautions Precautions: Fall Restrictions Weight Bearing Restrictions: No RLE Weight Bearing: Weight bearing as tolerated     Mobility  Bed Mobility Overal bed mobility: Needs Assistance Bed  Mobility: Supine to Sit     Supine to sit: Supervision     General bed mobility comments: increased time and effort, self assisting RLE as needed    Transfers Overall transfer level: Needs assistance Equipment used: Rolling walker (2 wheels) Transfers: Sit to/from Stand Sit to Stand: From elevated surface, Min assist, Contact guard assist           General transfer comment: min A to CGA with transfers from elevated bed and recliner, heavy use of BUE to power up, forward flexed posture in upright standing, assist for return to sitting to minimize uncontrolled descent    Ambulation/Gait Ambulation/Gait assistance: Contact guard assist, Min assist Gait Distance (Feet): 150 Feet Assistive device: Rolling walker (2 wheels) Gait Pattern/deviations: Step-to pattern, Decreased stride length, Antalgic, Trunk flexed Gait velocity: decreased     General Gait Details: forward trunk flexion with antalgic gait pattern, step to with limited RLE weight bearing and stance time, strong forward weight lean on RW educated pt on UE assist with upright posture, recliner follow for safety   Stairs Stairs: Yes Stairs assistance: Contact guard assist, +2 safety/equipment Stair Management: One rail Left Number of Stairs: 3 General stair comments: pt with strong BUE assisting on handrail, CGA +2 for safety to ascend/descend 3 steps, good sequencing, trunk flexed forward, slightly impulsive needing cues for safe speed, no LOB   Wheelchair Mobility     Tilt Bed    Modified Rankin (Stroke Patients Only)       Balance Overall balance assessment: Needs assistance Sitting-balance support: Feet supported Sitting balance-Leahy Scale: Good     Standing balance support: Reliant on assistive device  for balance, During functional activity, Bilateral upper extremity supported Standing balance-Leahy Scale: Poor                              Cognition Arousal: Alert Behavior During  Therapy: WFL for tasks assessed/performed Overall Cognitive Status: Within Functional Limits for tasks assessed                                          Exercises Total Joint Exercises Ankle Circles/Pumps: Supine, AROM, Strengthening, Both, 10 reps Quad Sets: Supine, Strengthening, Right, 5 reps Heel Slides: Supine, AROM, Right, 5 reps Long Arc Quad: Seated, AROM, Strengthening, Right, 5 reps    General Comments        Pertinent Vitals/Pain Pain Assessment Pain Assessment: 0-10 Pain Score: 7  Pain Location: R hip Pain Descriptors / Indicators: Aching, Constant, Discomfort, Sore Pain Intervention(s): Limited activity within patient's tolerance, Monitored during session, Premedicated before session, Repositioned, Ice applied    Home Living                          Prior Function            PT Goals (current goals can now be found in the care plan section) Acute Rehab PT Goals Patient Stated Goal: to be able to squirl hunt with the dogs PT Goal Formulation: With patient Time For Goal Achievement: 10/06/23 Potential to Achieve Goals: Good Progress towards PT goals: Progressing toward goals    Frequency    7X/week      PT Plan      Co-evaluation              AM-PAC PT "6 Clicks" Mobility   Outcome Measure  Help needed turning from your back to your side while in a flat bed without using bedrails?: None Help needed moving from lying on your back to sitting on the side of a flat bed without using bedrails?: A Little Help needed moving to and from a bed to a chair (including a wheelchair)?: A Little Help needed standing up from a chair using your arms (e.g., wheelchair or bedside chair)?: A Little Help needed to walk in hospital room?: A Little Help needed climbing 3-5 steps with a railing? : A Little 6 Click Score: 19    End of Session Equipment Utilized During Treatment: Gait belt Activity Tolerance: Patient tolerated  treatment well;Patient limited by pain Patient left: with call bell/phone within reach (on toilet in restroom, RN aware) Nurse Communication: Mobility status PT Visit Diagnosis: Unsteadiness on feet (R26.81);Other abnormalities of gait and mobility (R26.89);Muscle weakness (generalized) (M62.81);Pain;Difficulty in walking, not elsewhere classified (R26.2) Pain - Right/Left: Right Pain - part of body: Hip;Leg     Time: 7829-5621 PT Time Calculation (min) (ACUTE ONLY): 44 min  Charges:    $Gait Training: 8-22 mins $Therapeutic Exercise: 8-22 mins $Therapeutic Activity: 8-22 mins PT General Charges $$ ACUTE PT VISIT: 1 Visit                     Tori Natascha Edmonds PT, DPT 09/23/23, 12:55 PM

## 2023-09-23 NOTE — TOC Transition Note (Signed)
Transition of Care [facility]) - CM/SW Discharge Note   Patient Details  Name: Joshua Gomez MRN: [id number] Date of Birth: December 19, 1955  [facility] of [facility] CM/SW Contact:  Amada Jupiter, LCSW Phone Number: 09/23/2023, 11:22 AM   Clinical Narrative:    Met with pt who confirms he has needed DME in the home.  Plan for HEP.  No further TOC needs.   Final next level of care: Home/Self Care Barriers to Discharge: No Barriers Identified   Patient Goals and CMS Choice      Discharge Placement                         Discharge Plan and Services Additional resources added to the After Visit Summary for                  DME Arranged: N/A DME Agency: NA                  Social Determinants of Health (SDOH) Interventions SDOH Screenings   Food Insecurity: No Food Insecurity (09/22/2023)  Housing: Low Risk  (09/22/2023)  Transportation Needs: No Transportation Needs (09/22/2023)  Utilities: Not At Risk (09/22/2023)  Tobacco Use: Low Risk  (09/22/2023)  Recent Concern: Tobacco Use - Medium Risk (09/17/2023)   Received from [facility]     Readmission Risk Interventions     No data to display

## 2023-09-29 NOTE — Discharge Summary (Signed)
Patient ID: Joshua Gomez MRN: [id number] DOB/AGE: June 22, 1956 67 y.o.  Admit date: 09/22/2023 Discharge date: 09/23/2023  Admission Diagnoses:  Principal Problem:   OA (osteoarthritis) of knee Active Problems:   Primary osteoarthritis of right hip   Discharge Diagnoses:  Same  Past Medical History:  Diagnosis Date   Arthritis    BPH (benign prostatic hypertrophy)    Coronary artery disease    Feeling of incomplete bladder emptying    History of kidney stones    History of MRSA infection    2005--  left lower leg abscess w/ MRSA   Hyperlipidemia    Hypertension    Idiopathic peripheral neuropathy    Lower urinary tract symptoms (LUTS)    Myocardial infarction (HCC)    Pre-diabetes    Wears glasses    Wears hearing aid    BILATERAL    Surgeries: Procedure(s): TOTAL HIP ARTHROPLASTY ANTERIOR APPROACH on 09/22/2023   Consultants:   Discharged Condition: Improved  Hospital Course: Joshua Gomez is an 67 y.o. male who was admitted 09/22/2023 for operative treatment ofOA (osteoarthritis) of knee. Patient has severe unremitting pain that affects sleep, daily activities, and work/hobbies. After pre-op clearance the patient was taken to the operating room on 09/22/2023 and underwent  Procedure(s): TOTAL HIP ARTHROPLASTY ANTERIOR APPROACH.    Patient was given perioperative antibiotics:  Anti-infectives (From admission, onward)    Start     Dose/Rate Route Frequency Ordered Stop   09/22/23 1630  ceFAZolin (ANCEF) IVPB 2g/100 mL premix        2 g 200 mL/hr over 30 Minutes Intravenous Every 6 hours 09/22/23 1540 09/22/23 2315   09/22/23 0830  ceFAZolin (ANCEF) IVPB 3g/100 mL premix        3 g 200 mL/hr over 30 Minutes Intravenous On call to O.R. 09/22/23 1478 09/22/23 1107        Patient was given sequential compression devices, early ambulation, and chemoprophylaxis to prevent DVT.  Patient benefited maximally from hospital stay and there were no complications.     Recent vital signs: No data found.   Recent laboratory studies: No results for input(s): "WBC", "HGB", "HCT", "PLT", "NA", "K", "CL", "CO2", "BUN", "CREATININE", "GLUCOSE", "INR", "CALCIUM" in the last 72 hours.  Invalid input(s): "PT", "2"   Discharge Medications:   Allergies as of 09/23/2023   No Known Allergies      Medication List     STOP taking these medications    aspirin EC 81 MG tablet Replaced by: aspirin 81 MG chewable tablet   celecoxib 200 MG capsule Commonly known as: CELEBREX       TAKE these medications    acetaminophen 325 MG tablet Commonly known as: TYLENOL Take 2 tablets (650 mg total) by mouth every 4 (four) hours as needed for mild pain or moderate pain.   ascorbic acid 500 MG tablet Commonly known as: VITAMIN C Take 500 mg by mouth daily.   aspirin 81 MG chewable tablet Chew 1 tablet (81 mg total) by mouth 2 (two) times daily for 20 days. Then resume one 81 mg aspirin once a day Replaces: aspirin EC 81 MG tablet   COMPLETE PROSTATE HEALTH PO Take 1 capsule by mouth daily. Super Beta Prostate   DULoxetine 20 MG capsule Commonly known as: CYMBALTA Take 20 mg by mouth daily.   fluticasone 50 MCG/ACT nasal spray Commonly known as: FLONASE Place 2 sprays into both nostrils daily as needed for allergies.   HYDROcodone-acetaminophen 5-325 MG tablet Commonly  known as: NORCO/VICODIN Take 1-2 tablets by mouth every 6 (six) hours as needed for severe pain.   losartan 50 MG tablet Commonly known as: COZAAR Take 50 mg by mouth daily.   methocarbamol 500 MG tablet Commonly known as: ROBAXIN Take 1 tablet (500 mg total) by mouth every 6 (six) hours as needed for muscle spasms.   MULTIVITAMIN ADULT PO Take 1 tablet by mouth daily.   ondansetron 4 MG tablet Commonly known as: ZOFRAN Take 1 tablet (4 mg total) by mouth every 6 (six) hours as needed for nausea.   Repatha SureClick 140 MG/ML Soaj Generic drug: Evolocumab Inject 140 mg  into the skin every 14 (fourteen) days. For Investigational Use Only. Inject subcutaneously into abdomen, thigh, or upper arm every 14 days. Rotate injection sites and do not inject into areas where skin is tender, bruised, or red. Please contact  [facility] for any questions or concerns regarding this medication.   traMADol 50 MG tablet Commonly known as: ULTRAM Take 1-2 tablets (50-100 mg total) by mouth every 6 (six) hours as needed for moderate pain.   vitamin E 180 MG (400 UNITS) capsule Take 400 Units by mouth daily.   Wegovy 0.25 MG/0.5ML Soaj Generic drug: Semaglutide-Weight Management Inject 0.5 mg into the skin once a week. Wednesdays               Discharge Care Instructions  (From admission, onward)           Start     Ordered   09/23/23 0000  Weight bearing as tolerated        09/23/23 1158   09/23/23 0000  Change dressing       Comments: You have an adhesive waterproof bandage over the incision. Leave this in place until your first follow-up appointment. Once you remove this you will not need to place another bandage.   09/23/23 1158            Diagnostic Studies: DG HIP UNILAT WITH PELVIS 1V RIGHT  Result Date: 09/22/2023 CLINICAL DATA:  Elective surgery. EXAM: DG HIP (WITH OR WITHOUT PELVIS) 1V RIGHT COMPARISON:  CT abdomen pelvis 09/30/2010 FINDINGS: Images were performed intraoperatively without the presence of a radiologist. The patient is undergoing total right hip arthroplasty. No hardware complication is seen. Total fluoroscopy images: 9 Total fluoroscopy time: 9 seconds Total dose: Radiation Exposure Index (as provided by the fluoroscopic device): 1.56 mGy air Kerma Please see intraoperative findings for further detail. IMPRESSION: Intraoperative fluoroscopy for total right hip arthroplasty. Electronically Signed   By: Neita Garnet M.D.   On: 09/22/2023 13:46   DG Pelvis Portable  Result Date: 09/22/2023 CLINICAL DATA:  AP pelvis  EXAM: PORTABLE PELVIS 1-2 VIEWS COMPARISON:  CT abdomen and pelvis 09/30/2010 FINDINGS: Interval right hip arthroplasty. No perihardware lucency is seen to indicate hardware failure or loosening. Expected postoperative changes including lateral right hip subcutaneous air. Mild-to-moderate inferior right sacroiliac subchondral sclerosis is similar to prior. Mild-to-moderate pubic symphysis joint space narrowing. The left acetabular joint space is maintained. No acute fracture or dislocation. IMPRESSION: Interval total right hip arthroplasty. No evidence of hardware failure or loosening. Electronically Signed   By: Neita Garnet M.D.   On: 09/22/2023 13:37   DG C-Arm 1-60 Min-No Report  Result Date: 09/22/2023 Fluoroscopy was utilized by the requesting physician.  No radiographic interpretation.   DG C-Arm 1-60 Min-No Report  Result Date: 09/22/2023 Fluoroscopy was utilized by the requesting physician.  No radiographic interpretation.  Disposition: Discharge disposition: 01-Home or Self Care       Discharge Instructions     Call MD / Call 911   Complete by: As directed    If you experience chest pain or shortness of breath, CALL 911 and be transported to the hospital emergency room.  If you develope a fever above 101 F, pus (white drainage) or increased drainage or redness at the wound, or calf pain, call your surgeon's office.   Change dressing   Complete by: As directed    You have an adhesive waterproof bandage over the incision. Leave this in place until your first follow-up appointment. Once you remove this you will not need to place another bandage.   Constipation Prevention   Complete by: As directed    Drink plenty of fluids.  Prune juice may be helpful.  You may use a stool softener, such as Colace (over the counter) 100 mg twice a day.  Use MiraLax (over the counter) for constipation as needed.   Diet - low sodium heart healthy   Complete by: As directed    Do not sit on low  chairs, stoools or toilet seats, as it may be difficult to get up from low surfaces   Complete by: As directed    Driving restrictions   Complete by: As directed    No driving for two weeks   Post-operative opioid taper instructions:   Complete by: As directed    POST-OPERATIVE OPIOID TAPER INSTRUCTIONS: It is important to wean off of your opioid medication as soon as possible. If you do not need pain medication after your surgery it is ok to stop day one. Opioids include: Codeine, Hydrocodone(Norco, Vicodin), Oxycodone(Percocet, oxycontin) and hydromorphone amongst others.  Long term and even short term use of opiods can cause: Increased pain response Dependence Constipation Depression Respiratory depression And more.  Withdrawal symptoms can include Flu like symptoms Nausea, vomiting And more Techniques to manage these symptoms Hydrate well Eat regular healthy meals Stay active Use relaxation techniques(deep breathing, meditating, yoga) Do Not substitute Alcohol to help with tapering If you have been on opioids for less than two weeks and do not have pain than it is ok to stop all together.  Plan to wean off of opioids This plan should start within one week post op of your joint replacement. Maintain the same interval or time between taking each dose and first decrease the dose.  Cut the total daily intake of opioids by one tablet each day Next start to increase the time between doses. The last dose that should be eliminated is the evening dose.      TED hose   Complete by: As directed    Use stockings (TED hose) for three weeks on both leg(s).  You may remove them at night for sleeping.   Weight bearing as tolerated   Complete by: As directed         Follow-up Information     Aluisio, Homero Fellers, MD. Schedule an appointment as soon as possible for a visit in 2 week(s).   Specialty: Orthopedic Surgery Contact information: [address] [place]  [address] [phone]                  Signed: Arther Abbott 09/29/2023, 8:25 AM

## 2023-10-23 ENCOUNTER — Encounter: Payer: Self-pay | Admitting: *Deleted

## 2023-10-23 DIAGNOSIS — Z006 Encounter for examination for normal comparison and control in clinical research program: Secondary | ICD-10-CM

## 2023-10-31 NOTE — Research (Signed)
60 Week Follow-Up Visit Completed*   []  Not Necessary, No Potential Adverse Events Or Medication Issues Reported On Completed Subject Questionnaire   []  Yes, Contact With Subject/Alternate Contact Completed   [x]  Yes, No Contact With Subject/Alternate Contact Completed, But Electronic Health Record Was Reviewed   []  No, Unable To Contact Subject/Alternate Contact   Have you reviewed Ongoing medications on the Targeted Concomitant Medication form and updated the form as needed?   [x]  Yes   []  No   Subject Status*   [x]  Continuing In Follow-up   []  At Risk For Lost To Follow-up   []  Withdrawal From All Future Study Activities Including Passive Follow-up By Electronic Health Record Review Or Contact With Healthcare Provider Or Family Member/Friend   []  Death   Vital Status*   [x]  Alive   []  Deceased   []  Unknown   Last Known To Be Alive Source*   []  Subject Completed Follow-up Questionnaire/Seen In Person/Via Telephone Contact   []  Family Member or Caretaker   [x]  Primary Physician Or Medical Records   []  Publicly Available Source   []  Other

## 2023-11-04 ENCOUNTER — Other Ambulatory Visit (HOSPITAL_COMMUNITY): Payer: Self-pay

## 2023-11-04 ENCOUNTER — Other Ambulatory Visit: Payer: Self-pay

## 2023-11-04 NOTE — Progress Notes (Signed)
Specialty Pharmacy Initial Fill Coordination Note  Joshua Gomez is a 67 y.o. male contacted today regarding refills of specialty medication(s) Evolocumab   Patient requested Delivery   Delivery date: 11/18/23   Verified address: [address]   Medication will be filled on 12/2.   Patient is aware of $0 copayment.

## 2023-11-17 ENCOUNTER — Other Ambulatory Visit: Payer: Self-pay

## 2023-11-19 ENCOUNTER — Ambulatory Visit (HOSPITAL_COMMUNITY): Payer: Medicare Other | Attending: Internal Medicine

## 2023-11-23 ENCOUNTER — Other Ambulatory Visit: Payer: Self-pay | Admitting: Physician Assistant

## 2023-12-29 ENCOUNTER — Ambulatory Visit (HOSPITAL_COMMUNITY): Payer: Medicare Other | Attending: Cardiology

## 2023-12-29 DIAGNOSIS — I10 Essential (primary) hypertension: Secondary | ICD-10-CM | POA: Insufficient documentation

## 2023-12-29 DIAGNOSIS — I2510 Atherosclerotic heart disease of native coronary artery without angina pectoris: Secondary | ICD-10-CM | POA: Insufficient documentation

## 2023-12-29 LAB — ECHOCARDIOGRAM COMPLETE
Area-P 1/2: 2.94 cm2
S' Lateral: 3.4 cm

## 2023-12-29 MED ORDER — PERFLUTREN LIPID MICROSPHERE
3.0000 mL | INTRAVENOUS | Status: AC | PRN
Start: 2023-12-29 — End: 2023-12-29
  Administered 2023-12-29: 3 mL via INTRAVENOUS

## 2024-01-01 ENCOUNTER — Ambulatory Visit: Payer: Medicare Other | Admitting: Internal Medicine

## 2024-01-02 ENCOUNTER — Other Ambulatory Visit (HOSPITAL_COMMUNITY): Payer: Self-pay

## 2024-01-02 DIAGNOSIS — I2510 Atherosclerotic heart disease of native coronary artery without angina pectoris: Secondary | ICD-10-CM

## 2024-01-02 DIAGNOSIS — E785 Hyperlipidemia, unspecified: Secondary | ICD-10-CM

## 2024-01-02 NOTE — Progress Notes (Signed)
  Cardiology Office Note:  .   Date:  01/09/2024  ID:  Joshua Gomez, DOB [birth year], MRN [id number] PCP: [facility] Providers Cardiologist:  Parke Poisson, MD    History of Present Illness: .   Joshua Gomez is a 68 y.o. male.  Discussed the use of AI scribe software for clinical note transcription with the patient, who gave verbal consent to proceed.  History of Present Illness   The patient, with a history of aortic valve regurgitation and coronary artery disease status post stent placement, presents with occasional chest discomfort. The discomfort has not been severe enough to necessitate the use of nitroglycerin. The patient also reports a feeling of weakness in the legs, which he attributes to recent hip surgery. The patient denies any chest pain during or after the hip surgery. The patient is currently on aspirin and Repatha, and reports occasional nosebleeds.        ROS: negative except per HPI above.  Studies Reviewed: .        Results   LABS Total cholesterol: 92 mg/dL (21/3086) Triglycerides: 119 mg/dL (57/8469) LDL: 31 mg/dL (62/9528)  DIAGNOSTIC Echocardiogram: Mild aortic valve regurgitation, decreased left ventricular ejection fraction compared to prior study     Risk Assessment/Calculations:        Physical Exam:   VS:  BP 121/86   Pulse 69   Ht 5\' 11"  (1.803 m)   Wt 278 lb (126.1 kg)   SpO2 93%   BMI 38.77 kg/m    Wt Readings from Last 3 Encounters:  01/09/24 278 lb (126.1 kg)  09/22/23 265 lb (120.2 kg)  09/15/23 269 lb (122 kg)     Physical Exam   CHEST: Lungs clear to auscultation.     GEN: Well nourished, well developed in no acute distress NECK: No JVD; No carotid bruits CARDIAC: RRR, no murmurs, rubs, gallops RESPIRATORY:  Clear to auscultation without rales, wheezing or rhonchi  ABDOMEN: Soft, non-tender, non-distended EXTREMITIES:  No edema; No deformity   ASSESSMENT AND  PLAN: .    Assessment & Plan Coronary artery disease involving native coronary artery of native heart without angina pectoris  Hyperlipidemia LDL goal <70  Hypertension, unspecified type  Non-ST elevation (NSTEMI) myocardial infarction [facility])   Assessment and Plan    Aortic Valve Regurgitation Mild symptoms of chest discomfort, not requiring nitroglycerin. Echo shows leaky aortic valve, but not severe enough to warrant intervention at this time. Discussed the future possibility of minimally invasive procedures for valve replacement. -Repeat echo in 1 year to monitor valve function.  Coronary Artery Disease Status post stent placement in September 2023. Mild chest discomfort reported, but not severe enough to require nitroglycerin. -Continue Aspirin for antiplatelet therapy. We discussed using plavix in monotherapy, he had nosebleeds and excessive non-worrisome bleeding on Plavix and would prefer to continue current regimen, reasonable.  -Report any increase in chest discomfort for possible stress test or catheterization.  Lower Extremity Weakness Reports weakness in legs, possibly related to recent hip surgery. No cramping or pain reported. -Monitor symptoms and encourage regular walking for muscle strengthening. -If no improvement in 6 months, consider further evaluation.  Hyperlipidemia Cholesterol levels well controlled on Repatha. -Continue Repatha.  Follow-up in 6 months. If any worsening of chest discomfort or leg weakness, patient to contact office sooner.

## 2024-01-09 ENCOUNTER — Ambulatory Visit: Payer: Medicare Other | Attending: Internal Medicine | Admitting: Internal Medicine

## 2024-01-09 ENCOUNTER — Encounter: Payer: Self-pay | Admitting: Internal Medicine

## 2024-01-09 VITALS — BP 121/86 | HR 69 | Ht 71.0 in | Wt 278.0 lb

## 2024-01-09 DIAGNOSIS — I2510 Atherosclerotic heart disease of native coronary artery without angina pectoris: Secondary | ICD-10-CM | POA: Diagnosis not present

## 2024-01-09 DIAGNOSIS — I10 Essential (primary) hypertension: Secondary | ICD-10-CM | POA: Diagnosis not present

## 2024-01-09 DIAGNOSIS — I214 Non-ST elevation (NSTEMI) myocardial infarction: Secondary | ICD-10-CM

## 2024-01-09 DIAGNOSIS — E785 Hyperlipidemia, unspecified: Secondary | ICD-10-CM | POA: Diagnosis not present

## 2024-01-09 NOTE — Patient Instructions (Signed)
Medication Instructions:  Your physician recommends that you continue on your current medications as directed. Please refer to the Current Medication list given to you today.  *If you need a refill on your cardiac medications before your next appointment, please call your pharmacy*  Lab Work: None  Follow-Up: At [facility], you and your health needs are our priority.  As part of our continuing mission to provide you with exceptional heart care, we have created designated Provider Care Teams.  These Care Teams include your primary Cardiologist (physician) and Advanced Practice Providers (APPs -  Physician Assistants and Nurse Practitioners) who all work together to provide you with the care you need, when you need it.  Your next appointment:   6 month(s)  Provider:   Parke Poisson, MD

## 2024-01-19 ENCOUNTER — Telehealth: Payer: Self-pay

## 2024-01-19 NOTE — Telephone Encounter (Signed)
Called and spoke with patient as it doesn't look like he has reviewed his my chart message. Informed him of results and need for repeat echo in one year. This has been ordered. Pt voiced understanding,

## 2024-01-21 ENCOUNTER — Encounter: Payer: Self-pay | Admitting: *Deleted

## 2024-01-21 DIAGNOSIS — Z006 Encounter for examination for normal comparison and control in clinical research program: Secondary | ICD-10-CM

## 2024-01-21 NOTE — Research (Signed)
 72 week Follow-Up Visit Completed*   []  Not Necessary, No Potential Adverse Events Or Medication Issues Reported On Completed Subject Questionnaire   [x]  Yes, Contact With Subject/Alternate Contact Completed   []  Yes, No Contact With Subject/Alternate Contact Completed, But Electronic Health Record Was Reviewed   []  No, Unable To Contact Subject/Alternate Contact   Have you reviewed Ongoing medications on the Targeted Concomitant Medication form and updated the form as needed?   [x]  Yes   []  No   Subject Status*   [x]  Continuing In Follow-up   []  At Risk For Lost To Follow-up   []  Withdrawal From All Future Study Activities Including Passive Follow-up By Electronic Health Record Review Or Contact With Healthcare Provider Or Family Member/Friend   []  Death   Vital Status*   [x]  Alive   []  Deceased   []  Unknown   Last Known To Be Alive Source*   [x]  Subject Completed Follow-up Questionnaire/Seen In Person/Via Telephone Contact   []  Family Member or Caretaker   []  Primary Physician Or Medical Records   []  Publicly Available Source   []  Other  Date of last dose taken   21-Jan-2024  Over the last 12 weeks did the subject miss any doses?n/a  Over the last 12 weeks did the subject restart Evolocumab  after an interruption?n/a

## 2024-04-05 ENCOUNTER — Encounter: Payer: Self-pay | Admitting: *Deleted

## 2024-04-05 DIAGNOSIS — Z006 Encounter for examination for normal comparison and control in clinical research program: Secondary | ICD-10-CM

## 2024-04-27 NOTE — Research (Cosign Needed)
 Follow-Up Visit Completed*   []  Not Necessary, No Potential Adverse Events Or Medication Issues Reported On Completed Subject Questionnaire   []  Yes, Contact With Subject/Alternate Contact Completed   [x]  Yes, No Contact With Subject/Alternate Contact Completed, But Electronic Health Record Was Reviewed   []  No, Unable To Contact Subject/Alternate Contact   Have you reviewed Ongoing medications on the Targeted Concomitant Medication form and updated the form as needed?   [x]  Yes   []  No   Subject Status*   [x]  Continuing In Follow-up   []  At Risk For Lost To Follow-up   []  Withdrawal From All Future Study Activities Including Passive Follow-up By Electronic Health Record Review Or Contact With Healthcare Provider Or Family Member/Friend   []  Death   Vital Status*   [x]  Alive   []  Deceased   []  Unknown   Last Known To Be Alive Source*   []  Subject Completed Follow-up Questionnaire/Seen In Person/Via Telephone Contact   []  Family Member or Caretaker   [x]  Primary Physician Or Medical Records   []  Publicly Available Source   []  Other

## 2024-05-03 ENCOUNTER — Other Ambulatory Visit: Payer: Self-pay

## 2024-05-03 NOTE — Progress Notes (Signed)
 Specialty Pharmacy Refill Coordination Note  Joshua Gomez is a 68 y.o. male contacted today regarding refills of specialty medication(s) Evolocumab  (Repatha  SureClick)   Patient requested Delivery   Delivery date: 05/19/24   Verified address: [address]   Medication will be filled on 05/18/24.

## 2024-08-23 ENCOUNTER — Encounter: Payer: Self-pay | Admitting: *Deleted

## 2024-08-23 DIAGNOSIS — Z006 Encounter for examination for normal comparison and control in clinical research program: Secondary | ICD-10-CM

## 2024-08-23 NOTE — Research (Signed)
 Week 96Follow-Up Visit Completed*   []  Not Necessary, No Potential Adverse Events Or Medication Issues Reported On Completed Subject Questionnaire   []  Yes, Contact With Subject/Alternate Contact Completed   [x]  Yes, No Contact With Subject/Alternate Contact Completed, But Electronic Health Record Was Reviewed   []  No, Unable To Contact Subject/Alternate Contact   Have you reviewed Ongoing medications on the Targeted Concomitant Medication form and updated the form as needed?   [x]  Yes   []  No   Subject Status*   [x]  Continuing In Follow-up   []  At Risk For Lost To Follow-up   []  Withdrawal From All Future Study Activities Including Passive Follow-up By Electronic Health Record Review Or Contact With Healthcare Provider Or Family Member/Friend   []  Death   Vital Status*   [x]  Alive   []  Deceased   []  Unknown   Last Known To Be Alive Source*   []  Subject Completed Follow-up Questionnaire/Seen In Person/Via Telephone Contact   []  Family Member or Caretaker   [x]  Primary Physician Or Medical Records   []  Publicly Available Source   []  Other

## 2024-09-27 NOTE — Progress Notes (Signed)
 Patient is aware of lab results as per Monica Aho, PA-C

## 2024-10-20 ENCOUNTER — Encounter: Payer: Self-pay | Admitting: *Deleted

## 2024-10-20 DIAGNOSIS — Z006 Encounter for examination for normal comparison and control in clinical research program: Secondary | ICD-10-CM

## 2024-10-20 NOTE — Research (Signed)
 Week 108 Follow-Up Visit Completed*   []  Not Necessary, No Potential Adverse Events Or Medication Issues Reported On Completed Subject Questionnaire   [x]  Yes, Contact With Subject/Alternate Contact Completed   []  Yes, No Contact With Subject/Alternate Contact Completed, But Electronic Health Record Was Reviewed   []  No, Unable To Contact Subject/Alternate Contact   Have you reviewed Ongoing medications on the Targeted Concomitant Medication form and updated the form as needed?   [x]  Yes   []  No   Subject Status*   [x]  Continuing In Follow-up   []  At Risk For Lost To Follow-up   []  Withdrawal From All Future Study Activities Including Passive Follow-up By Electronic Health Record Review Or Contact With Healthcare Provider Or Family Member/Friend   []  Death   Vital Status*   [x]  Alive   []  Deceased   []  Unknown   Last Known To Be Alive Source*   [x]  Subject Completed Follow-up Questionnaire/Seen In Person/Via Telephone Contact   []  Family Member or Caretaker   []  Primary Physician Or Medical Records   []  Publicly Available Source   []  Other  Date of last dose taken   15-Oct-2024  Over the last 12 weeks did the subject miss any doses?no   Over the last 12 weeks did the subject restart Evolocumab  after an interruption?no

## 2024-12-15 ENCOUNTER — Other Ambulatory Visit (HOSPITAL_COMMUNITY): Payer: Self-pay | Admitting: Cardiology

## 2024-12-15 ENCOUNTER — Other Ambulatory Visit (HOSPITAL_COMMUNITY): Payer: Self-pay

## 2024-12-15 ENCOUNTER — Other Ambulatory Visit: Payer: Self-pay

## 2024-12-15 MED ORDER — REPATHA SURECLICK 140 MG/ML ~~LOC~~ SOAJ
140.0000 mg | SUBCUTANEOUS | 2 refills | Status: AC
  Filled 2024-12-15: qty 12, 168d supply, fill #0

## 2024-12-17 ENCOUNTER — Other Ambulatory Visit (HOSPITAL_COMMUNITY): Payer: Self-pay

## 2024-12-21 ENCOUNTER — Other Ambulatory Visit: Payer: Self-pay

## 2024-12-21 NOTE — Progress Notes (Signed)
 Specialty Pharmacy Refill Coordination Note  Joshua Gomez is a 69 y.o. male contacted today regarding refills of specialty medication(s) Evolocumab  (Repatha  SureClick)   Patient requested Delivery   Delivery date: 12/29/24   Verified address: [address]   Medication will be filled on: 12/28/24

## 2024-12-28 ENCOUNTER — Other Ambulatory Visit: Payer: Self-pay

## 2024-12-30 ENCOUNTER — Ambulatory Visit (HOSPITAL_COMMUNITY)
Admission: RE | Admit: 2024-12-30 | Discharge: 2024-12-30 | Disposition: A | Discharge status: Home / Self Care | Source: Ambulatory Visit | Attending: Internal Medicine | Admitting: Internal Medicine

## 2024-12-30 DIAGNOSIS — E785 Hyperlipidemia, unspecified: Secondary | ICD-10-CM | POA: Insufficient documentation

## 2024-12-30 DIAGNOSIS — I2510 Atherosclerotic heart disease of native coronary artery without angina pectoris: Secondary | ICD-10-CM | POA: Diagnosis present

## 2024-12-30 LAB — ECHOCARDIOGRAM COMPLETE: S' Lateral: 2.48 cm

## 2024-12-30 MED ORDER — PERFLUTREN LIPID MICROSPHERE
1.0000 mL | INTRAVENOUS | Status: AC | PRN
  Administered 2024-12-30: 3 mL via INTRAVENOUS

## 2025-01-05 ENCOUNTER — Ambulatory Visit: Payer: Self-pay | Admitting: Internal Medicine

## 2025-01-07 NOTE — Telephone Encounter (Signed)
 Patient returned RN's call regarding results.

## 2025-02-23 ENCOUNTER — Ambulatory Visit: Admitting: Internal Medicine
